# Patient Record
Sex: Female | Born: 1935
Health system: Southern US, Community
[De-identification: ages and names within clinical notes are randomized; demographics above are authoritative.]

## PROBLEM LIST (undated history)

## (undated) DIAGNOSIS — N816 Rectocele: Secondary | ICD-10-CM

## (undated) DIAGNOSIS — Z8249 Family history of ischemic heart disease and other diseases of the circulatory system: Secondary | ICD-10-CM

## (undated) DIAGNOSIS — B1910 Unspecified viral hepatitis B without hepatic coma: Secondary | ICD-10-CM

## (undated) DIAGNOSIS — R0789 Other chest pain: Secondary | ICD-10-CM

## (undated) DIAGNOSIS — R300 Dysuria: Secondary | ICD-10-CM

## (undated) DIAGNOSIS — K219 Gastro-esophageal reflux disease without esophagitis: Secondary | ICD-10-CM

## (undated) DIAGNOSIS — R310 Gross hematuria: Secondary | ICD-10-CM

## (undated) DIAGNOSIS — D649 Anemia, unspecified: Secondary | ICD-10-CM

## (undated) DIAGNOSIS — Z860100 Personal history of colon polyps, unspecified: Secondary | ICD-10-CM

## (undated) DIAGNOSIS — K5731 Diverticulosis of large intestine without perforation or abscess with bleeding: Secondary | ICD-10-CM

## (undated) DIAGNOSIS — I10 Essential (primary) hypertension: Secondary | ICD-10-CM

## (undated) DIAGNOSIS — M8580 Other specified disorders of bone density and structure, unspecified site: Secondary | ICD-10-CM

## (undated) DIAGNOSIS — R0989 Other specified symptoms and signs involving the circulatory and respiratory systems: Secondary | ICD-10-CM

## (undated) DIAGNOSIS — E785 Hyperlipidemia, unspecified: Secondary | ICD-10-CM

## (undated) DIAGNOSIS — H409 Unspecified glaucoma: Secondary | ICD-10-CM

## (undated) DIAGNOSIS — N8110 Cystocele, unspecified: Secondary | ICD-10-CM

## (undated) HISTORY — DX: Anemia, unspecified: D64.9

## (undated) HISTORY — DX: Other chest pain: R07.89

## (undated) HISTORY — DX: Cystocele, unspecified: N81.10

## (undated) HISTORY — DX: Unspecified viral hepatitis B without hepatic coma: B19.10

## (undated) HISTORY — DX: Gross hematuria: R31.0

## (undated) HISTORY — DX: Family history of ischemic heart disease and other diseases of the circulatory system: Z82.49

## (undated) HISTORY — DX: Gastro-esophageal reflux disease without esophagitis: K21.9

## (undated) HISTORY — DX: Other specified symptoms and signs involving the circulatory and respiratory systems: R09.89

## (undated) HISTORY — DX: Diverticulosis of large intestine without perforation or abscess with bleeding: K57.31

## (undated) HISTORY — DX: Other specified disorders of bone density and structure, unspecified site: M85.80

## (undated) HISTORY — DX: Hyperlipidemia, unspecified: E78.5

## (undated) HISTORY — DX: Personal history of colon polyps, unspecified: Z86.0100

## (undated) HISTORY — DX: Rectocele: N81.6

## (undated) HISTORY — DX: Essential (primary) hypertension: I10

## (undated) HISTORY — DX: Unspecified glaucoma: H40.9

## (undated) HISTORY — DX: Dysuria: R30.0

## (undated) HISTORY — PX: KNEE SURGERY: SHX244

## (undated) HISTORY — DX: Personal history of colonic polyps: Z86.010

---

## 1996-03-05 HISTORY — PX: BREAST REDUCTION SURGERY: SHX8

## 2002-03-05 HISTORY — PX: KNEE ARTHROSCOPY: SUR90

## 2002-07-26 ENCOUNTER — Encounter: Payer: Self-pay | Admitting: Unknown Physician Specialty

## 2002-07-26 ENCOUNTER — Encounter: Admission: RE | Admit: 2002-07-26 | Discharge: 2002-07-26 | Payer: Self-pay | Admitting: Unknown Physician Specialty

## 2003-03-06 HISTORY — PX: KNEE ARTHROSCOPY: SUR90

## 2010-11-08 ENCOUNTER — Other Ambulatory Visit: Payer: Self-pay | Admitting: Obstetrics and Gynecology

## 2010-11-08 DIAGNOSIS — N644 Mastodynia: Secondary | ICD-10-CM

## 2010-11-15 ENCOUNTER — Ambulatory Visit
Admission: RE | Admit: 2010-11-15 | Discharge: 2010-11-15 | Disposition: A | Discharge status: Home / Self Care | Payer: Medicare Other | Source: Ambulatory Visit | Attending: Obstetrics and Gynecology | Admitting: Obstetrics and Gynecology

## 2010-11-15 DIAGNOSIS — N644 Mastodynia: Secondary | ICD-10-CM

## 2012-02-11 HISTORY — PX: COLONOSCOPY: SHX174

## 2012-06-18 HISTORY — PX: UPPER GASTROINTESTINAL ENDOSCOPY: SHX188

## 2014-03-22 DIAGNOSIS — L659 Nonscarring hair loss, unspecified: Secondary | ICD-10-CM | POA: Diagnosis not present

## 2014-03-22 DIAGNOSIS — R002 Palpitations: Secondary | ICD-10-CM | POA: Diagnosis not present

## 2014-03-22 DIAGNOSIS — R0989 Other specified symptoms and signs involving the circulatory and respiratory systems: Secondary | ICD-10-CM | POA: Diagnosis not present

## 2014-03-22 DIAGNOSIS — Z23 Encounter for immunization: Secondary | ICD-10-CM | POA: Diagnosis not present

## 2014-03-22 DIAGNOSIS — Z0000 Encounter for general adult medical examination without abnormal findings: Secondary | ICD-10-CM | POA: Diagnosis not present

## 2014-03-22 DIAGNOSIS — I10 Essential (primary) hypertension: Secondary | ICD-10-CM | POA: Diagnosis not present

## 2014-03-22 DIAGNOSIS — R300 Dysuria: Secondary | ICD-10-CM | POA: Diagnosis not present

## 2014-03-22 DIAGNOSIS — Z87820 Personal history of traumatic brain injury: Secondary | ICD-10-CM | POA: Diagnosis not present

## 2014-03-30 DIAGNOSIS — R0989 Other specified symptoms and signs involving the circulatory and respiratory systems: Secondary | ICD-10-CM | POA: Diagnosis not present

## 2014-03-30 DIAGNOSIS — Z87820 Personal history of traumatic brain injury: Secondary | ICD-10-CM | POA: Diagnosis not present

## 2014-03-30 DIAGNOSIS — S0990XA Unspecified injury of head, initial encounter: Secondary | ICD-10-CM | POA: Diagnosis not present

## 2014-03-30 DIAGNOSIS — R42 Dizziness and giddiness: Secondary | ICD-10-CM | POA: Diagnosis not present

## 2014-03-30 DIAGNOSIS — S199XXA Unspecified injury of neck, initial encounter: Secondary | ICD-10-CM | POA: Diagnosis not present

## 2014-03-30 DIAGNOSIS — I6522 Occlusion and stenosis of left carotid artery: Secondary | ICD-10-CM | POA: Diagnosis not present

## 2014-03-31 DIAGNOSIS — Z01419 Encounter for gynecological examination (general) (routine) without abnormal findings: Secondary | ICD-10-CM | POA: Diagnosis not present

## 2014-03-31 DIAGNOSIS — R350 Frequency of micturition: Secondary | ICD-10-CM | POA: Diagnosis not present

## 2014-05-04 DIAGNOSIS — H4011X1 Primary open-angle glaucoma, mild stage: Secondary | ICD-10-CM | POA: Diagnosis not present

## 2014-05-18 DIAGNOSIS — N3000 Acute cystitis without hematuria: Secondary | ICD-10-CM | POA: Diagnosis not present

## 2014-05-18 DIAGNOSIS — N3091 Cystitis, unspecified with hematuria: Secondary | ICD-10-CM | POA: Diagnosis not present

## 2014-06-15 DIAGNOSIS — H4011X1 Primary open-angle glaucoma, mild stage: Secondary | ICD-10-CM | POA: Diagnosis not present

## 2014-06-23 DIAGNOSIS — N8182 Incompetence or weakening of pubocervical tissue: Secondary | ICD-10-CM | POA: Diagnosis not present

## 2014-07-09 DIAGNOSIS — R0602 Shortness of breath: Secondary | ICD-10-CM | POA: Diagnosis not present

## 2014-07-09 DIAGNOSIS — R300 Dysuria: Secondary | ICD-10-CM | POA: Diagnosis not present

## 2014-07-09 DIAGNOSIS — N3091 Cystitis, unspecified with hematuria: Secondary | ICD-10-CM | POA: Diagnosis not present

## 2014-09-03 DIAGNOSIS — L299 Pruritus, unspecified: Secondary | ICD-10-CM | POA: Diagnosis not present

## 2014-09-03 DIAGNOSIS — R1013 Epigastric pain: Secondary | ICD-10-CM | POA: Diagnosis not present

## 2014-10-14 DIAGNOSIS — L219 Seborrheic dermatitis, unspecified: Secondary | ICD-10-CM | POA: Diagnosis not present

## 2014-10-14 DIAGNOSIS — L300 Nummular dermatitis: Secondary | ICD-10-CM | POA: Diagnosis not present

## 2014-10-14 DIAGNOSIS — L820 Inflamed seborrheic keratosis: Secondary | ICD-10-CM | POA: Diagnosis not present

## 2014-11-09 DIAGNOSIS — S2232XA Fracture of one rib, left side, initial encounter for closed fracture: Secondary | ICD-10-CM | POA: Diagnosis not present

## 2014-11-12 DIAGNOSIS — I313 Pericardial effusion (noninflammatory): Secondary | ICD-10-CM | POA: Diagnosis not present

## 2014-11-12 DIAGNOSIS — S2242XA Multiple fractures of ribs, left side, initial encounter for closed fracture: Secondary | ICD-10-CM | POA: Diagnosis not present

## 2014-11-12 DIAGNOSIS — J90 Pleural effusion, not elsewhere classified: Secondary | ICD-10-CM | POA: Diagnosis not present

## 2014-12-07 DIAGNOSIS — N8182 Incompetence or weakening of pubocervical tissue: Secondary | ICD-10-CM | POA: Diagnosis not present

## 2015-01-12 DIAGNOSIS — R0789 Other chest pain: Secondary | ICD-10-CM | POA: Diagnosis not present

## 2015-01-12 DIAGNOSIS — R32 Unspecified urinary incontinence: Secondary | ICD-10-CM | POA: Diagnosis not present

## 2015-01-12 DIAGNOSIS — J918 Pleural effusion in other conditions classified elsewhere: Secondary | ICD-10-CM | POA: Diagnosis not present

## 2015-01-12 DIAGNOSIS — R079 Chest pain, unspecified: Secondary | ICD-10-CM | POA: Diagnosis not present

## 2015-01-12 DIAGNOSIS — R0602 Shortness of breath: Secondary | ICD-10-CM | POA: Diagnosis not present

## 2015-01-12 DIAGNOSIS — M549 Dorsalgia, unspecified: Secondary | ICD-10-CM | POA: Diagnosis not present

## 2015-02-03 DIAGNOSIS — N390 Urinary tract infection, site not specified: Secondary | ICD-10-CM | POA: Diagnosis not present

## 2015-02-03 DIAGNOSIS — N3941 Urge incontinence: Secondary | ICD-10-CM | POA: Diagnosis not present

## 2015-02-16 DIAGNOSIS — I491 Atrial premature depolarization: Secondary | ICD-10-CM | POA: Diagnosis not present

## 2015-02-16 DIAGNOSIS — I10 Essential (primary) hypertension: Secondary | ICD-10-CM | POA: Diagnosis not present

## 2015-02-16 DIAGNOSIS — R0609 Other forms of dyspnea: Secondary | ICD-10-CM | POA: Diagnosis not present

## 2015-04-17 DIAGNOSIS — H6123 Impacted cerumen, bilateral: Secondary | ICD-10-CM | POA: Diagnosis not present

## 2015-04-17 DIAGNOSIS — J069 Acute upper respiratory infection, unspecified: Secondary | ICD-10-CM | POA: Diagnosis not present

## 2015-05-09 DIAGNOSIS — H401132 Primary open-angle glaucoma, bilateral, moderate stage: Secondary | ICD-10-CM | POA: Diagnosis not present

## 2015-06-07 DIAGNOSIS — N8182 Incompetence or weakening of pubocervical tissue: Secondary | ICD-10-CM | POA: Diagnosis not present

## 2015-07-01 DIAGNOSIS — H25811 Combined forms of age-related cataract, right eye: Secondary | ICD-10-CM | POA: Diagnosis not present

## 2015-07-01 DIAGNOSIS — H04123 Dry eye syndrome of bilateral lacrimal glands: Secondary | ICD-10-CM | POA: Diagnosis not present

## 2015-07-01 DIAGNOSIS — H52221 Regular astigmatism, right eye: Secondary | ICD-10-CM | POA: Diagnosis not present

## 2015-07-01 DIAGNOSIS — Z961 Presence of intraocular lens: Secondary | ICD-10-CM | POA: Diagnosis not present

## 2015-07-08 DIAGNOSIS — M810 Age-related osteoporosis without current pathological fracture: Secondary | ICD-10-CM | POA: Diagnosis not present

## 2015-07-08 DIAGNOSIS — Z1239 Encounter for other screening for malignant neoplasm of breast: Secondary | ICD-10-CM | POA: Diagnosis not present

## 2015-07-08 DIAGNOSIS — Z1231 Encounter for screening mammogram for malignant neoplasm of breast: Secondary | ICD-10-CM | POA: Diagnosis not present

## 2015-08-16 DIAGNOSIS — Z8619 Personal history of other infectious and parasitic diseases: Secondary | ICD-10-CM | POA: Diagnosis not present

## 2015-08-16 DIAGNOSIS — H409 Unspecified glaucoma: Secondary | ICD-10-CM | POA: Diagnosis not present

## 2015-08-16 DIAGNOSIS — K219 Gastro-esophageal reflux disease without esophagitis: Secondary | ICD-10-CM | POA: Diagnosis not present

## 2015-08-16 DIAGNOSIS — I10 Essential (primary) hypertension: Secondary | ICD-10-CM | POA: Diagnosis not present

## 2015-08-16 DIAGNOSIS — Z79899 Other long term (current) drug therapy: Secondary | ICD-10-CM | POA: Diagnosis not present

## 2015-08-16 DIAGNOSIS — H25811 Combined forms of age-related cataract, right eye: Secondary | ICD-10-CM | POA: Diagnosis not present

## 2015-09-02 DIAGNOSIS — N3000 Acute cystitis without hematuria: Secondary | ICD-10-CM | POA: Diagnosis not present

## 2015-09-02 DIAGNOSIS — R300 Dysuria: Secondary | ICD-10-CM | POA: Diagnosis not present

## 2015-10-17 DIAGNOSIS — M810 Age-related osteoporosis without current pathological fracture: Secondary | ICD-10-CM | POA: Diagnosis not present

## 2015-10-17 DIAGNOSIS — N8182 Incompetence or weakening of pubocervical tissue: Secondary | ICD-10-CM | POA: Diagnosis not present

## 2016-01-17 DIAGNOSIS — R1011 Right upper quadrant pain: Secondary | ICD-10-CM | POA: Diagnosis not present

## 2016-01-17 DIAGNOSIS — D510 Vitamin B12 deficiency anemia due to intrinsic factor deficiency: Secondary | ICD-10-CM | POA: Diagnosis not present

## 2016-01-17 DIAGNOSIS — I10 Essential (primary) hypertension: Secondary | ICD-10-CM | POA: Diagnosis not present

## 2016-01-17 DIAGNOSIS — Z0000 Encounter for general adult medical examination without abnormal findings: Secondary | ICD-10-CM | POA: Diagnosis not present

## 2016-01-17 DIAGNOSIS — Z23 Encounter for immunization: Secondary | ICD-10-CM | POA: Diagnosis not present

## 2016-01-17 DIAGNOSIS — Z8619 Personal history of other infectious and parasitic diseases: Secondary | ICD-10-CM | POA: Diagnosis not present

## 2016-01-20 DIAGNOSIS — R1011 Right upper quadrant pain: Secondary | ICD-10-CM | POA: Diagnosis not present

## 2016-04-10 DIAGNOSIS — H401132 Primary open-angle glaucoma, bilateral, moderate stage: Secondary | ICD-10-CM | POA: Diagnosis not present

## 2016-05-03 DIAGNOSIS — I10 Essential (primary) hypertension: Secondary | ICD-10-CM | POA: Diagnosis not present

## 2016-05-03 DIAGNOSIS — R0609 Other forms of dyspnea: Secondary | ICD-10-CM | POA: Diagnosis not present

## 2016-05-03 DIAGNOSIS — I491 Atrial premature depolarization: Secondary | ICD-10-CM | POA: Diagnosis not present

## 2016-07-03 DIAGNOSIS — I10 Essential (primary) hypertension: Secondary | ICD-10-CM | POA: Diagnosis not present

## 2016-07-03 DIAGNOSIS — R079 Chest pain, unspecified: Secondary | ICD-10-CM | POA: Diagnosis not present

## 2016-07-03 DIAGNOSIS — R002 Palpitations: Secondary | ICD-10-CM | POA: Diagnosis not present

## 2016-07-04 DIAGNOSIS — I10 Essential (primary) hypertension: Secondary | ICD-10-CM | POA: Diagnosis not present

## 2016-07-04 DIAGNOSIS — R002 Palpitations: Secondary | ICD-10-CM | POA: Diagnosis not present

## 2016-07-04 DIAGNOSIS — R079 Chest pain, unspecified: Secondary | ICD-10-CM | POA: Diagnosis not present

## 2016-07-12 DIAGNOSIS — Z124 Encounter for screening for malignant neoplasm of cervix: Secondary | ICD-10-CM | POA: Diagnosis not present

## 2016-07-12 DIAGNOSIS — Z1231 Encounter for screening mammogram for malignant neoplasm of breast: Secondary | ICD-10-CM | POA: Diagnosis not present

## 2016-07-12 DIAGNOSIS — Z01419 Encounter for gynecological examination (general) (routine) without abnormal findings: Secondary | ICD-10-CM | POA: Diagnosis not present

## 2016-09-19 DIAGNOSIS — M25551 Pain in right hip: Secondary | ICD-10-CM | POA: Diagnosis not present

## 2016-09-26 DIAGNOSIS — N8110 Cystocele, unspecified: Secondary | ICD-10-CM | POA: Diagnosis not present

## 2016-10-15 DIAGNOSIS — S32501A Unspecified fracture of right pubis, initial encounter for closed fracture: Secondary | ICD-10-CM | POA: Diagnosis not present

## 2016-10-15 DIAGNOSIS — S32511A Fracture of superior rim of right pubis, initial encounter for closed fracture: Secondary | ICD-10-CM | POA: Diagnosis not present

## 2016-10-15 DIAGNOSIS — R079 Chest pain, unspecified: Secondary | ICD-10-CM | POA: Diagnosis not present

## 2016-10-15 DIAGNOSIS — M25551 Pain in right hip: Secondary | ICD-10-CM | POA: Diagnosis not present

## 2016-10-15 DIAGNOSIS — K5730 Diverticulosis of large intestine without perforation or abscess without bleeding: Secondary | ICD-10-CM | POA: Diagnosis not present

## 2016-11-01 DIAGNOSIS — M9903 Segmental and somatic dysfunction of lumbar region: Secondary | ICD-10-CM | POA: Diagnosis not present

## 2016-11-01 DIAGNOSIS — S39012A Strain of muscle, fascia and tendon of lower back, initial encounter: Secondary | ICD-10-CM | POA: Diagnosis not present

## 2016-11-01 DIAGNOSIS — M9904 Segmental and somatic dysfunction of sacral region: Secondary | ICD-10-CM | POA: Diagnosis not present

## 2016-11-01 DIAGNOSIS — S336XXA Sprain of sacroiliac joint, initial encounter: Secondary | ICD-10-CM | POA: Diagnosis not present

## 2016-11-01 DIAGNOSIS — M5136 Other intervertebral disc degeneration, lumbar region: Secondary | ICD-10-CM | POA: Diagnosis not present

## 2016-11-06 DIAGNOSIS — S32511A Fracture of superior rim of right pubis, initial encounter for closed fracture: Secondary | ICD-10-CM | POA: Diagnosis not present

## 2016-11-06 DIAGNOSIS — M810 Age-related osteoporosis without current pathological fracture: Secondary | ICD-10-CM | POA: Diagnosis not present

## 2016-11-20 DIAGNOSIS — R262 Difficulty in walking, not elsewhere classified: Secondary | ICD-10-CM | POA: Diagnosis not present

## 2016-11-20 DIAGNOSIS — M25551 Pain in right hip: Secondary | ICD-10-CM | POA: Diagnosis not present

## 2016-11-20 DIAGNOSIS — M25651 Stiffness of right hip, not elsewhere classified: Secondary | ICD-10-CM | POA: Diagnosis not present

## 2016-11-20 DIAGNOSIS — M6281 Muscle weakness (generalized): Secondary | ICD-10-CM | POA: Diagnosis not present

## 2016-11-22 DIAGNOSIS — R262 Difficulty in walking, not elsewhere classified: Secondary | ICD-10-CM | POA: Diagnosis not present

## 2016-11-22 DIAGNOSIS — M25651 Stiffness of right hip, not elsewhere classified: Secondary | ICD-10-CM | POA: Diagnosis not present

## 2016-11-22 DIAGNOSIS — M25551 Pain in right hip: Secondary | ICD-10-CM | POA: Diagnosis not present

## 2016-11-22 DIAGNOSIS — M6281 Muscle weakness (generalized): Secondary | ICD-10-CM | POA: Diagnosis not present

## 2016-11-26 DIAGNOSIS — R262 Difficulty in walking, not elsewhere classified: Secondary | ICD-10-CM | POA: Diagnosis not present

## 2016-11-26 DIAGNOSIS — M6281 Muscle weakness (generalized): Secondary | ICD-10-CM | POA: Diagnosis not present

## 2016-11-26 DIAGNOSIS — M25551 Pain in right hip: Secondary | ICD-10-CM | POA: Diagnosis not present

## 2016-11-26 DIAGNOSIS — M25651 Stiffness of right hip, not elsewhere classified: Secondary | ICD-10-CM | POA: Diagnosis not present

## 2016-11-26 DIAGNOSIS — M810 Age-related osteoporosis without current pathological fracture: Secondary | ICD-10-CM | POA: Diagnosis not present

## 2016-11-27 DIAGNOSIS — H401132 Primary open-angle glaucoma, bilateral, moderate stage: Secondary | ICD-10-CM | POA: Diagnosis not present

## 2016-11-28 DIAGNOSIS — M25651 Stiffness of right hip, not elsewhere classified: Secondary | ICD-10-CM | POA: Diagnosis not present

## 2016-11-28 DIAGNOSIS — R262 Difficulty in walking, not elsewhere classified: Secondary | ICD-10-CM | POA: Diagnosis not present

## 2016-11-28 DIAGNOSIS — M25551 Pain in right hip: Secondary | ICD-10-CM | POA: Diagnosis not present

## 2016-11-28 DIAGNOSIS — M6281 Muscle weakness (generalized): Secondary | ICD-10-CM | POA: Diagnosis not present

## 2016-12-04 DIAGNOSIS — R262 Difficulty in walking, not elsewhere classified: Secondary | ICD-10-CM | POA: Diagnosis not present

## 2016-12-04 DIAGNOSIS — M25551 Pain in right hip: Secondary | ICD-10-CM | POA: Diagnosis not present

## 2016-12-04 DIAGNOSIS — M6281 Muscle weakness (generalized): Secondary | ICD-10-CM | POA: Diagnosis not present

## 2016-12-04 DIAGNOSIS — S32511A Fracture of superior rim of right pubis, initial encounter for closed fracture: Secondary | ICD-10-CM | POA: Diagnosis not present

## 2016-12-04 DIAGNOSIS — M25651 Stiffness of right hip, not elsewhere classified: Secondary | ICD-10-CM | POA: Diagnosis not present

## 2016-12-06 DIAGNOSIS — M6281 Muscle weakness (generalized): Secondary | ICD-10-CM | POA: Diagnosis not present

## 2016-12-06 DIAGNOSIS — M25651 Stiffness of right hip, not elsewhere classified: Secondary | ICD-10-CM | POA: Diagnosis not present

## 2016-12-06 DIAGNOSIS — M25551 Pain in right hip: Secondary | ICD-10-CM | POA: Diagnosis not present

## 2016-12-06 DIAGNOSIS — R262 Difficulty in walking, not elsewhere classified: Secondary | ICD-10-CM | POA: Diagnosis not present

## 2016-12-28 DIAGNOSIS — Z23 Encounter for immunization: Secondary | ICD-10-CM | POA: Diagnosis not present

## 2017-01-04 DIAGNOSIS — M1711 Unilateral primary osteoarthritis, right knee: Secondary | ICD-10-CM | POA: Diagnosis not present

## 2017-01-04 DIAGNOSIS — S32511A Fracture of superior rim of right pubis, initial encounter for closed fracture: Secondary | ICD-10-CM | POA: Diagnosis not present

## 2017-01-04 DIAGNOSIS — M25561 Pain in right knee: Secondary | ICD-10-CM | POA: Diagnosis not present

## 2017-01-07 DIAGNOSIS — M1711 Unilateral primary osteoarthritis, right knee: Secondary | ICD-10-CM | POA: Diagnosis not present

## 2017-01-28 DIAGNOSIS — N8182 Incompetence or weakening of pubocervical tissue: Secondary | ICD-10-CM | POA: Diagnosis not present

## 2017-02-18 DIAGNOSIS — J029 Acute pharyngitis, unspecified: Secondary | ICD-10-CM | POA: Diagnosis not present

## 2017-02-21 DIAGNOSIS — M545 Low back pain: Secondary | ICD-10-CM | POA: Diagnosis not present

## 2017-03-04 DIAGNOSIS — M545 Low back pain: Secondary | ICD-10-CM | POA: Diagnosis not present

## 2017-03-04 DIAGNOSIS — M25551 Pain in right hip: Secondary | ICD-10-CM | POA: Diagnosis not present

## 2017-03-06 DIAGNOSIS — M5416 Radiculopathy, lumbar region: Secondary | ICD-10-CM | POA: Diagnosis not present

## 2017-03-06 DIAGNOSIS — M545 Low back pain: Secondary | ICD-10-CM | POA: Diagnosis not present

## 2017-03-06 DIAGNOSIS — S3992XA Unspecified injury of lower back, initial encounter: Secondary | ICD-10-CM | POA: Diagnosis not present

## 2017-03-07 DIAGNOSIS — M545 Low back pain: Secondary | ICD-10-CM | POA: Diagnosis not present

## 2017-05-09 DIAGNOSIS — R300 Dysuria: Secondary | ICD-10-CM | POA: Diagnosis not present

## 2017-05-30 DIAGNOSIS — I10 Essential (primary) hypertension: Secondary | ICD-10-CM | POA: Diagnosis not present

## 2017-05-30 DIAGNOSIS — M7989 Other specified soft tissue disorders: Secondary | ICD-10-CM | POA: Diagnosis not present

## 2017-05-30 DIAGNOSIS — R6889 Other general symptoms and signs: Secondary | ICD-10-CM | POA: Diagnosis not present

## 2017-06-13 DIAGNOSIS — D510 Vitamin B12 deficiency anemia due to intrinsic factor deficiency: Secondary | ICD-10-CM | POA: Diagnosis not present

## 2017-06-13 DIAGNOSIS — I10 Essential (primary) hypertension: Secondary | ICD-10-CM | POA: Diagnosis not present

## 2017-06-13 DIAGNOSIS — E559 Vitamin D deficiency, unspecified: Secondary | ICD-10-CM | POA: Diagnosis not present

## 2017-06-13 DIAGNOSIS — M7989 Other specified soft tissue disorders: Secondary | ICD-10-CM | POA: Diagnosis not present

## 2017-06-13 DIAGNOSIS — R109 Unspecified abdominal pain: Secondary | ICD-10-CM | POA: Diagnosis not present

## 2017-06-13 DIAGNOSIS — M130 Polyarthritis, unspecified: Secondary | ICD-10-CM | POA: Diagnosis not present

## 2017-06-13 DIAGNOSIS — Z7689 Persons encountering health services in other specified circumstances: Secondary | ICD-10-CM | POA: Diagnosis not present

## 2017-06-17 ENCOUNTER — Encounter: Payer: Self-pay | Admitting: Gastroenterology

## 2017-06-18 DIAGNOSIS — R1011 Right upper quadrant pain: Secondary | ICD-10-CM | POA: Diagnosis not present

## 2017-06-27 DIAGNOSIS — R932 Abnormal findings on diagnostic imaging of liver and biliary tract: Secondary | ICD-10-CM | POA: Diagnosis not present

## 2017-06-27 DIAGNOSIS — I10 Essential (primary) hypertension: Secondary | ICD-10-CM | POA: Diagnosis not present

## 2017-06-27 DIAGNOSIS — E538 Deficiency of other specified B group vitamins: Secondary | ICD-10-CM | POA: Diagnosis not present

## 2017-07-12 ENCOUNTER — Encounter: Payer: Self-pay | Admitting: Gastroenterology

## 2017-07-16 DIAGNOSIS — Y92009 Unspecified place in unspecified non-institutional (private) residence as the place of occurrence of the external cause: Secondary | ICD-10-CM | POA: Diagnosis not present

## 2017-07-16 DIAGNOSIS — W19XXXA Unspecified fall, initial encounter: Secondary | ICD-10-CM | POA: Diagnosis not present

## 2017-07-16 DIAGNOSIS — R0781 Pleurodynia: Secondary | ICD-10-CM | POA: Diagnosis not present

## 2017-08-07 ENCOUNTER — Encounter: Payer: Self-pay | Admitting: Gastroenterology

## 2017-08-08 ENCOUNTER — Other Ambulatory Visit (INDEPENDENT_AMBULATORY_CARE_PROVIDER_SITE_OTHER): Payer: Medicare Other

## 2017-08-08 ENCOUNTER — Encounter: Payer: Self-pay | Admitting: Gastroenterology

## 2017-08-08 ENCOUNTER — Telehealth: Payer: Self-pay

## 2017-08-08 ENCOUNTER — Ambulatory Visit (INDEPENDENT_AMBULATORY_CARE_PROVIDER_SITE_OTHER): Payer: Medicare Other | Admitting: Gastroenterology

## 2017-08-08 VITALS — BP 140/72 | HR 58 | Ht 62.0 in | Wt 172.5 lb

## 2017-08-08 DIAGNOSIS — R109 Unspecified abdominal pain: Secondary | ICD-10-CM | POA: Diagnosis not present

## 2017-08-08 DIAGNOSIS — R935 Abnormal findings on diagnostic imaging of other abdominal regions, including retroperitoneum: Secondary | ICD-10-CM | POA: Diagnosis not present

## 2017-08-08 LAB — CBC WITH DIFFERENTIAL/PLATELET
BASOS PCT: 0.4 % (ref 0.0–3.0)
Basophils Absolute: 0 10*3/uL (ref 0.0–0.1)
EOS PCT: 1.6 % (ref 0.0–5.0)
Eosinophils Absolute: 0.1 10*3/uL (ref 0.0–0.7)
HEMATOCRIT: 36.6 % (ref 36.0–46.0)
Hemoglobin: 11.9 g/dL — ABNORMAL LOW (ref 12.0–15.0)
LYMPHS PCT: 31.2 % (ref 12.0–46.0)
Lymphs Abs: 2 10*3/uL (ref 0.7–4.0)
MCHC: 32.5 g/dL (ref 30.0–36.0)
MCV: 89.5 fl (ref 78.0–100.0)
MONOS PCT: 8 % (ref 3.0–12.0)
Monocytes Absolute: 0.5 10*3/uL (ref 0.1–1.0)
NEUTROS ABS: 3.7 10*3/uL (ref 1.4–7.7)
Neutrophils Relative %: 58.8 % (ref 43.0–77.0)
PLATELETS: 274 10*3/uL (ref 150.0–400.0)
RBC: 4.08 Mil/uL (ref 3.87–5.11)
RDW: 15.1 % (ref 11.5–15.5)
WBC: 6.3 10*3/uL (ref 4.0–10.5)

## 2017-08-08 LAB — COMPREHENSIVE METABOLIC PANEL
ALBUMIN: 4.1 g/dL (ref 3.5–5.2)
ALT: 10 U/L (ref 0–35)
AST: 13 U/L (ref 0–37)
Alkaline Phosphatase: 115 U/L (ref 39–117)
BUN: 26 mg/dL — ABNORMAL HIGH (ref 6–23)
CALCIUM: 9.6 mg/dL (ref 8.4–10.5)
CHLORIDE: 108 meq/L (ref 96–112)
CO2: 26 mEq/L (ref 19–32)
CREATININE: 0.97 mg/dL (ref 0.40–1.20)
GFR: 58.69 mL/min — ABNORMAL LOW (ref >60.00)
Glucose, Bld: 93 mg/dL (ref 70–99)
POTASSIUM: 4.1 meq/L (ref 3.5–5.1)
Sodium: 141 mEq/L (ref 135–145)
Total Bilirubin: 0.5 mg/dL (ref 0.2–1.2)
Total Protein: 6.3 g/dL (ref 6.0–8.3)

## 2017-08-08 LAB — SEDIMENTATION RATE: Sed Rate: 4 mm/hr (ref 0–30)

## 2017-08-08 LAB — C-REACTIVE PROTEIN: CRP: 0.1 mg/dL — AB (ref 0.5–20.0)

## 2017-08-08 MED ORDER — PREDNISONE 10 MG PO TABS: ORAL_TABLET | ORAL | 0 refills | Status: DC

## 2017-08-08 MED ORDER — DIPHENHYDRAMINE HCL 25 MG PO CAPS: ORAL_CAPSULE | ORAL | 0 refills | Status: DC

## 2017-08-08 NOTE — Telephone Encounter (Signed)
Called and let patient know that we sent medication in to pharmacy.

## 2017-08-08 NOTE — Progress Notes (Signed)
Chief Complaint: Right-sided abdominal pain/flank pain  Referring Provider:  Dr Cathi Roan      ASSESSMENT AND PLAN;   #1. Abnormal US showing fatty liver/hepatocellular disease 06/2017-patient with history of ? Hep B (diagnosed over 15 years ago when she went to donate blood)  #2. R Flank/RMQ pain - neg US kidneys 06/2017.  Last colonoscopy 02/2012 showing moderate to severe sigmoid diverticulosis, small internal hemorrhoids. I do believe that there is significant musculoskeletal component with associated kyphoscoliosis.  #3. GERD with HH on nexium 20mg  po qd. last EGD 06/2012 showing large hiatal hernia.  #4.  History of hepatitis B in the past. -Positive hepatitis B core antibody and positive hepatitis B surface antibody.  Appears to have cleared.  I could not find hepatitis B surface antigen in the previous labs.  Plan: -Check CBC, CMP, CRP,sed rate, HBsAg, HBV viral load. -Proceed with CT scan of the abdomen and pelvis with p.o. and IV contrast to further evaluate abdominal pain/abnormal ultrasound. -If she continues to have problems, would consider colonoscopy for further evaluation. -She can try using icy hot patch in the morning and heating pads at night to take care of musculoskeletal component. -I discussed above in detail with the patient and patient's family.   HPI:    Melinda Briggs is a 82 y.o. female   With history of chronic right flank pain lasting over last several years but getting worse lately. She had an ultrasound abdomen and renal ultrasound performed which shows fatty liver/chronic hepatocellular disease.  Patient was quite concerned about the same.  She describes this pain as more or less constant, nonradiating, exacerbated occasionally after eating, denies having any significant urinary problems.  No nausea, vomiting, heartburn, regurgitation, odynophagia or dysphagia.  No significant diarrhea or constipation.  There is no melena or hematochezia. No  unintentional weight loss.  No history of itching, skin lesions, easy bruisability, intake of over-the-counter medications including diet pills, herbal medications, anabolic steroids or Tylenol.  There is no history of blood transfusions, IV drug use or family history of liver disease.  No jaundice dark urine or pale stools.  No history of alcohol abuse.    Past Medical History:  Diagnosis Date  . Anemia   . Carotid bruit   . Diverticulosis of colon with hemorrhage   . Dysuria   . Family history of ischemic heart disease (IHD)   . GERD (gastroesophageal reflux disease)   . Glaucoma   . Hematuria, gross   . Hepatitis B   . History of colon polyps   . Hyperlipidemia   . Hypertension   . Non-cardiac chest pain     Past Surgical History:  Procedure Laterality Date  . BREAST REDUCTION SURGERY  1998  . COLONOSCOPY  02/11/2012   Moderate to severe sigmoid diverticulosis, small internal hemorrhoids.  Marland Kitchen KNEE ARTHROSCOPY  2004  . KNEE ARTHROSCOPY  2005  . KNEE SURGERY     x3  . UPPER GASTROINTESTINAL ENDOSCOPY  06/18/2012   Presbyesophagus, large hiatal hernia, healed cameron erosion, mild gastritis.    Family History  Problem Relation Age of Onset  . Breast cancer Maternal Grandmother   . Colon cancer Paternal Grandmother     Social History   Tobacco Use  . Smoking status: Never Smoker  . Smokeless tobacco: Never Used  Substance Use Topics  . Alcohol use: Not Currently  . Drug use: Never    Current Outpatient Medications  Medication Sig Dispense Refill  . bimatoprost (LUMIGAN)  0.03 % ophthalmic solution Place 1 drop into both eyes at bedtime.    . celecoxib (CELEBREX) 200 MG capsule Take 200 mg by mouth daily.    Marland Kitchen esomeprazole (NEXIUM) 20 MG capsule Take 20 mg by mouth daily before breakfast.    . lisinopril-hydrochlorothiazide (PRINZIDE,ZESTORETIC) 20-25 MG tablet Take 1 tablet by mouth daily.    . Metoprolol Succinate 25 MG CS24 Take 25 mg by mouth daily.     No  current facility-administered medications for this visit.     Not on File  Review of Systems:  Constitutional: Denies fever, chills, diaphoresis, appetite change and fatigue.  HEENT: Denies photophobia, eye pain, redness, hearing loss, ear pain, congestion, sore throat, rhinorrhea, sneezing, mouth sores, neck pain, neck stiffness and tinnitus.   Respiratory: Denies SOB, DOE, cough, chest tightness,  and wheezing.   Cardiovascular: Denies chest pain, palpitations and leg swelling.  Genitourinary: Denies dysuria, urgency, frequency, hematuria, flank pain and difficulty urinating.  Musculoskeletal: Has myalgias, back pain, joint swelling, arthralgias and gait problem.  Skin: No rash.  Neurological: Denies dizziness, seizures, syncope, weakness, light-headedness, numbness and headaches. Has back pain Hematological: Denies adenopathy. Easy bruising, personal or family bleeding history  Psychiatric/Behavioral: Has anxiety or depression     Physical Exam:    BP 140/72   Pulse (!) 58   Ht 5\' 2"  (1.575 m)   Wt 172 lb 8 oz (78.2 kg)   BMI 31.55 kg/m  Filed Weights   08/08/17 0840  Weight: 172 lb 8 oz (78.2 kg)   Constitutional:  Well-developed, in no acute distress. Psychiatric: Normal mood and affect. Behavior is normal. HEENT: Pupils normal.  Conjunctivae are normal. No scleral icterus. Neck supple.  Cardiovascular: Normal rate, regular rhythm. No edema Pulmonary/chest: Effort normal and breath sounds normal. No wheezing, rales or rhonchi. Abdominal: Soft, nondistended. Nontender. Bowel sounds active throughout. There are no masses palpable. No hepatomegaly. Rectal:  defered Neurological: Alert and oriented to person place and time. Skin: Skin is warm and dry. No rashes noted.  Extensive notes were reviewed.  I also reviewed the previous EGD and colonoscopy report    Carmell Austria, MD 08/08/2017, 8:53 AM  Cc: Dr. Cathi Roan

## 2017-08-08 NOTE — Patient Instructions (Signed)
If you are age 80 or older, your body mass index should be between 23-30. Your Body mass index is 31.55 kg/m. If this is out of the aforementioned range listed, please consider follow up with your Primary Care Provider.  If you are age 41 or younger, your body mass index should be between 19-25. Your Body mass index is 31.55 kg/m. If this is out of the aformentioned range listed, please consider follow up with your Primary Care Provider.    Please stop at the lab before you leave the office today.    You have been scheduled for a CT scan of the abdomen and pelvis at [place] are scheduled on  at . You should arrive 15 minutes prior to your appointment time for registration. Please follow the written instructions below on the day of your exam:  WARNING: IF YOU ARE ALLERGIC TO IODINE/X-RAY DYE, PLEASE NOTIFY RADIOLOGY IMMEDIATELY AT [phone]! YOU WILL BE GIVEN A 13 HOUR PREMEDICATION PREP.  1) Do not eat or drink anything after  (4 hours prior to your test) 2) You have been given 2 bottles of oral contrast to drink. The solution may taste better if refrigerated, but do NOT add ice or any other liquid to this solution. Shake well before drinking.    Drink 1 bottle of contrast @  (2 hours prior to your exam)  Drink 1 bottle of contrast @  (1 hour prior to your exam)  You may take any medications as prescribed with a small amount of water except for the following: Metformin, Glucophage, Glucovance, Avandamet, Riomet, Fortamet, Actoplus Met, Janumet, Glumetza or Metaglip. The above medications must be held the day of the exam AND 48 hours after the exam.  The purpose of you drinking the oral contrast is to aid in the visualization of your intestinal tract. The contrast solution may cause some diarrhea. Before your exam is started, you will be given a small amount of fluid to drink. Depending on your individual set of symptoms, you may also receive an intravenous injection  of x-ray contrast/dye. Plan on being at [facility] for 30 minutes or longer, depending on the type of exam you are having performed.  This test typically takes 30-45 minutes to complete.  If you have any questions regarding your exam or if you need to reschedule, you may call the CT department at [phone] between the hours of 8:00 am and 5:00 pm, Monday-Friday.  ________________________________________________________________________  Thank you,  Dr. Jackquline Denmark

## 2017-08-09 DIAGNOSIS — H40013 Open angle with borderline findings, low risk, bilateral: Secondary | ICD-10-CM | POA: Diagnosis not present

## 2017-08-09 LAB — HBV QUANT PCR RFX TO GENOTYPE: HBV IU/ML: NOT DETECTED [IU]/mL

## 2017-08-09 LAB — HEPATITIS B SURFACE ANTIGEN: Hepatitis B Surface Ag: NONREACTIVE

## 2017-08-12 ENCOUNTER — Telehealth: Payer: Self-pay | Admitting: Gastroenterology

## 2017-08-12 ENCOUNTER — Other Ambulatory Visit (HOSPITAL_BASED_OUTPATIENT_CLINIC_OR_DEPARTMENT_OTHER): Payer: Self-pay

## 2017-08-12 NOTE — Telephone Encounter (Signed)
Pt is scheduled for a CT scan abd  Tomorrow. Pt is requesting if imaging can include her left shoulder because it has been bothering her lately. Pls call her.

## 2017-08-12 NOTE — Telephone Encounter (Signed)
I spoke with the patient and let her know that we are unable to order a CT of her shoulder.  Suggested she contact PCP.

## 2017-08-13 ENCOUNTER — Ambulatory Visit (HOSPITAL_BASED_OUTPATIENT_CLINIC_OR_DEPARTMENT_OTHER)
Admission: RE | Admit: 2017-08-13 | Discharge: 2017-08-13 | Disposition: A | Discharge status: Home / Self Care | Payer: Medicare Other | Source: Ambulatory Visit | Attending: Gastroenterology | Admitting: Gastroenterology

## 2017-08-13 ENCOUNTER — Encounter (HOSPITAL_BASED_OUTPATIENT_CLINIC_OR_DEPARTMENT_OTHER): Payer: Self-pay

## 2017-08-13 DIAGNOSIS — R935 Abnormal findings on diagnostic imaging of other abdominal regions, including retroperitoneum: Secondary | ICD-10-CM

## 2017-08-13 DIAGNOSIS — I700 Atherosclerosis of aorta: Secondary | ICD-10-CM | POA: Diagnosis not present

## 2017-08-13 DIAGNOSIS — R109 Unspecified abdominal pain: Secondary | ICD-10-CM | POA: Diagnosis present

## 2017-08-13 DIAGNOSIS — M438X6 Other specified deforming dorsopathies, lumbar region: Secondary | ICD-10-CM | POA: Diagnosis not present

## 2017-08-13 DIAGNOSIS — K449 Diaphragmatic hernia without obstruction or gangrene: Secondary | ICD-10-CM | POA: Diagnosis not present

## 2017-08-13 DIAGNOSIS — S3210XA Unspecified fracture of sacrum, initial encounter for closed fracture: Secondary | ICD-10-CM | POA: Diagnosis not present

## 2017-08-13 DIAGNOSIS — X58XXXA Exposure to other specified factors, initial encounter: Secondary | ICD-10-CM | POA: Diagnosis not present

## 2017-08-13 MED ORDER — IOPAMIDOL (ISOVUE-300) INJECTION 61%
100.0000 mL | Freq: Once | INTRAVENOUS | Status: AC | PRN
  Administered 2017-08-13: 100 mL via INTRAVENOUS

## 2017-08-13 NOTE — Telephone Encounter (Signed)
I went over her lab results with her, again.

## 2017-08-16 ENCOUNTER — Telehealth: Payer: Self-pay | Admitting: Gastroenterology

## 2017-08-16 NOTE — Telephone Encounter (Signed)
Patient calling for CT results from 6.11.19.

## 2017-08-16 NOTE — Telephone Encounter (Signed)
See results notes for additional details. 

## 2017-08-19 ENCOUNTER — Telehealth: Payer: Self-pay | Admitting: Gastroenterology

## 2017-08-19 NOTE — Telephone Encounter (Signed)
See result note.  

## 2017-08-19 NOTE — Telephone Encounter (Signed)
Pt returned your call regarding CT results.

## 2017-10-07 DIAGNOSIS — R109 Unspecified abdominal pain: Secondary | ICD-10-CM | POA: Diagnosis not present

## 2017-10-07 DIAGNOSIS — I10 Essential (primary) hypertension: Secondary | ICD-10-CM | POA: Diagnosis not present

## 2017-10-07 DIAGNOSIS — G8929 Other chronic pain: Secondary | ICD-10-CM | POA: Diagnosis not present

## 2017-10-07 DIAGNOSIS — R0789 Other chest pain: Secondary | ICD-10-CM | POA: Diagnosis not present

## 2018-01-23 DIAGNOSIS — I10 Essential (primary) hypertension: Secondary | ICD-10-CM | POA: Diagnosis not present

## 2018-01-23 DIAGNOSIS — Z4689 Encounter for fitting and adjustment of other specified devices: Secondary | ICD-10-CM | POA: Diagnosis not present

## 2018-02-12 DIAGNOSIS — I10 Essential (primary) hypertension: Secondary | ICD-10-CM | POA: Diagnosis not present

## 2018-02-12 DIAGNOSIS — N3281 Overactive bladder: Secondary | ICD-10-CM | POA: Diagnosis not present

## 2018-02-12 DIAGNOSIS — Z0000 Encounter for general adult medical examination without abnormal findings: Secondary | ICD-10-CM | POA: Diagnosis not present

## 2018-02-12 DIAGNOSIS — Z13820 Encounter for screening for osteoporosis: Secondary | ICD-10-CM | POA: Diagnosis not present

## 2018-02-12 DIAGNOSIS — Z23 Encounter for immunization: Secondary | ICD-10-CM | POA: Diagnosis not present

## 2018-03-24 DIAGNOSIS — H40013 Open angle with borderline findings, low risk, bilateral: Secondary | ICD-10-CM | POA: Diagnosis not present

## 2018-07-05 IMAGING — CT CT ABD-PELV W/ CM
2 of 5 series · 16 of 46 positions shown, 18 images · IV contrast (APPLIED)
Comparison: 06/18/2017 renal ultrasound.

CLINICAL DATA: Right-sided flank pain for years.

EXAM:
CT ABDOMEN AND PELVIS WITH CONTRAST
TECHNIQUE: Multidetector CT imaging of the abdomen and pelvis was performed
using the standard protocol following bolus administration of
intravenous contrast.
CONTRAST:  100mL VVPGN6-SCC IOPAMIDOL (VVPGN6-SCC) INJECTION 61%

[Series 2: axial st · axial · 0.95mm/px · z∈[-524,-49]mm · 13 of 107 slices shown, 15 images]
[im 6/107  soft-tissue]
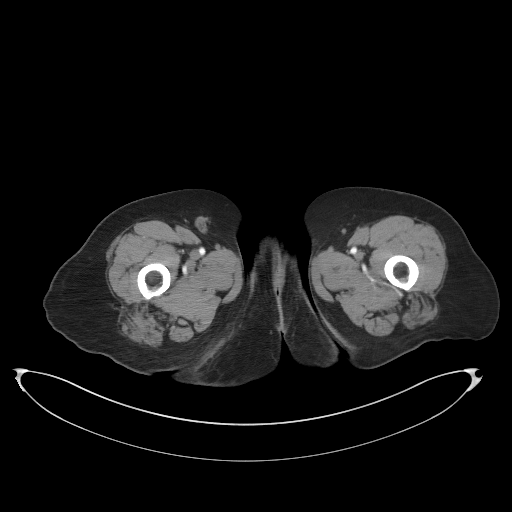
[im 6/107  bone]
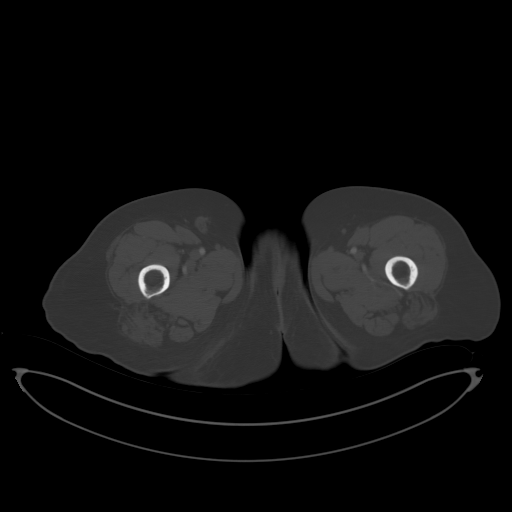
[im 16/107  soft-tissue]
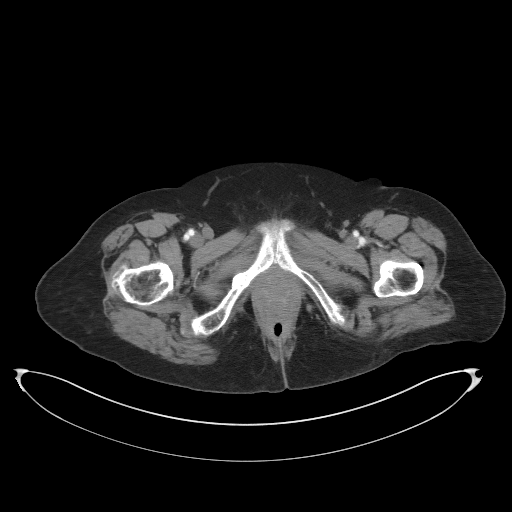
[im 21/107  soft-tissue]
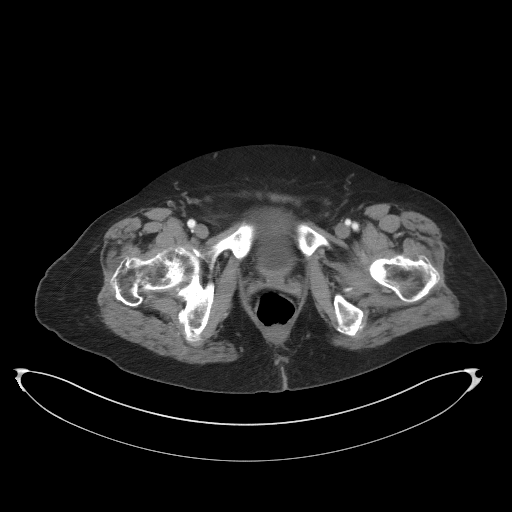
[im 31/107  soft-tissue]
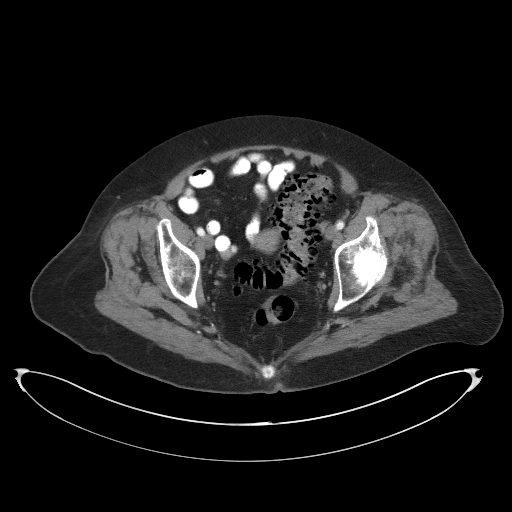
[im 36/107  soft-tissue]
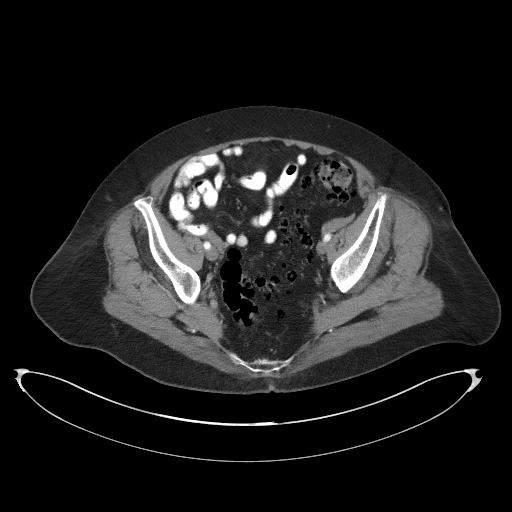
[im 46/107  soft-tissue]
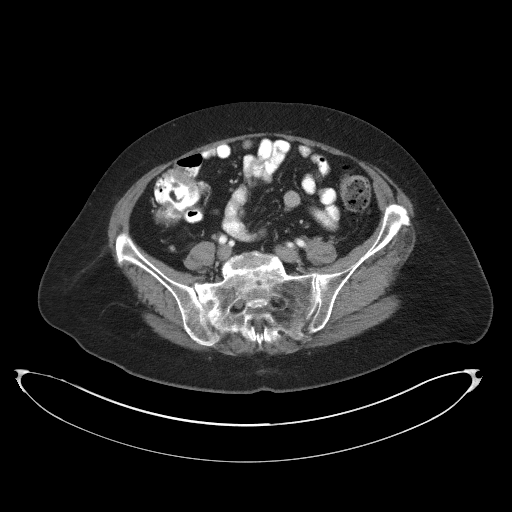
[im 56/107  soft-tissue]
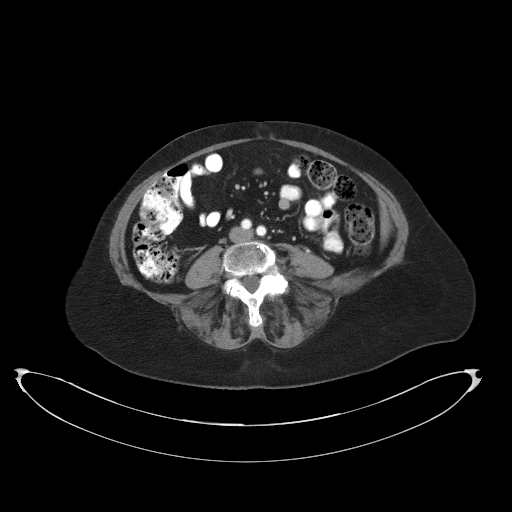
[im 61/107  soft-tissue]
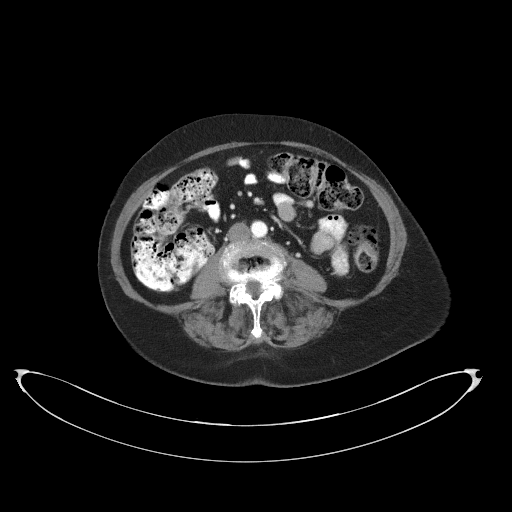
[im 71/107  soft-tissue]
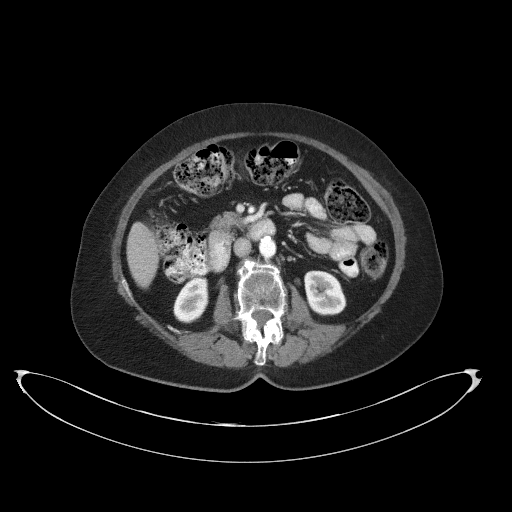
[im 71/107  bone]
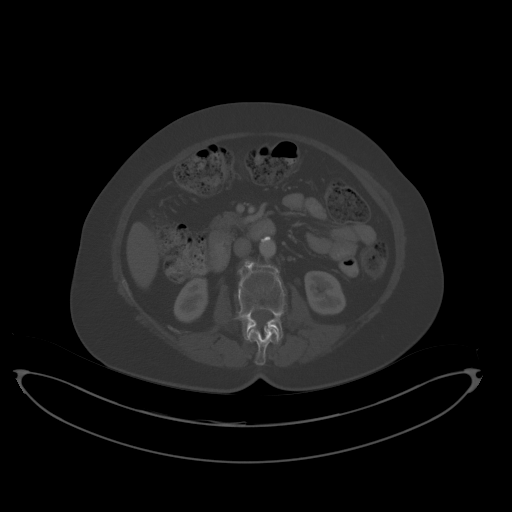
[im 76/107  soft-tissue]
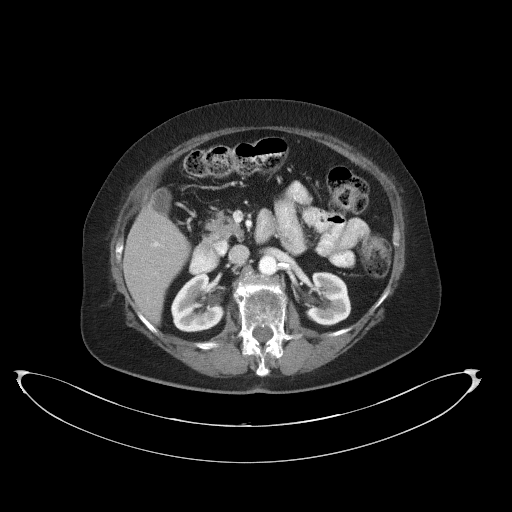
[im 86/107  soft-tissue]
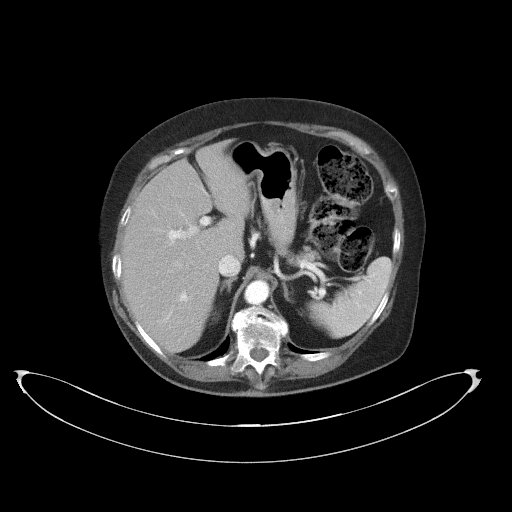
[im 91/107  soft-tissue]
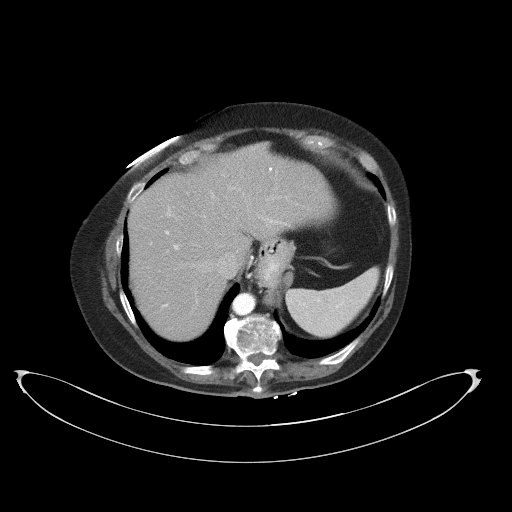
[im 101/107  soft-tissue]
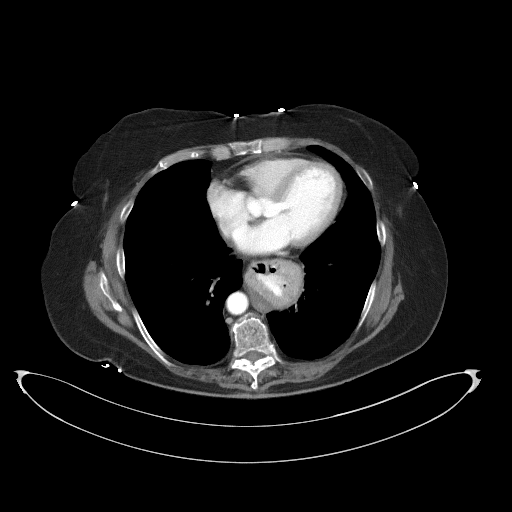

[Series 5: coronal st · coronal · 0.86mm/px · 3 of 97 slices shown]
[im 33/97  soft-tissue]
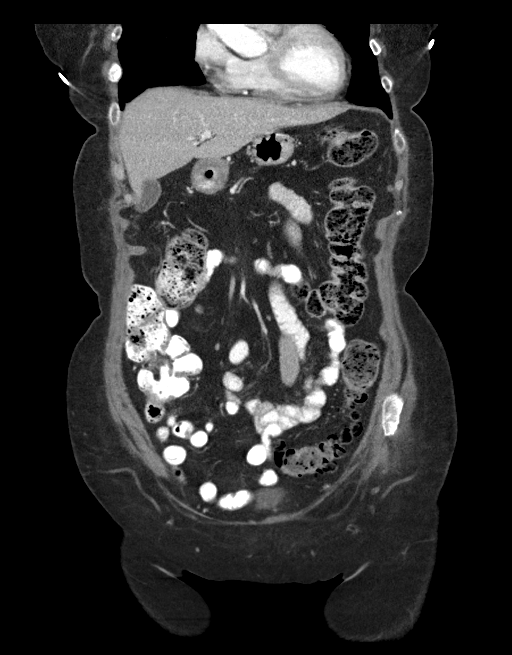
[im 43/97  soft-tissue]
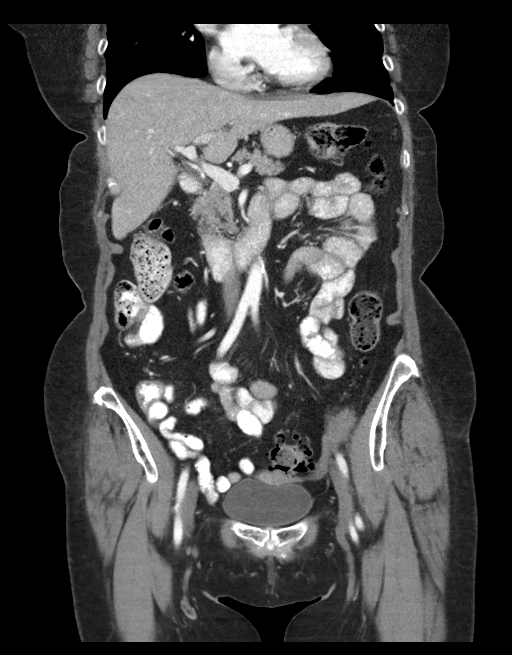
[im 54/97  soft-tissue]
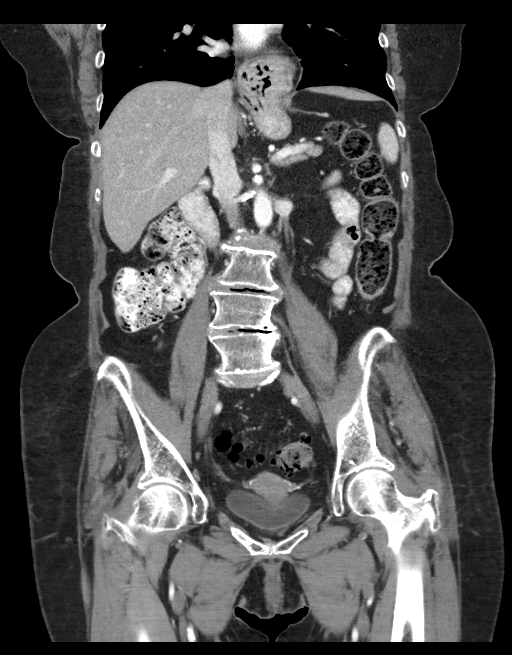

[16 of 46 positions shown; findings below may reference images not displayed]

Pelvic ultrasound
scattered pelvic CT 10/15/2016. Abdomen pelvic CT 11/01/2010 (report
not available).
FINDINGS: Lower chest: Clear lung bases. Normal heart size without pericardial
or pleural effusion. Moderate hiatal hernia.

Hepatobiliary: Right hepatic lobe too small to characterize lesions.
Normal gallbladder, without biliary ductal dilatation.

Pancreas: Normal, without mass or ductal dilatation.

Spleen: Normal in size, without focal abnormality.

Adrenals/Urinary Tract: Normal adrenal glands. Normal kidneys,
without hydronephrosis. Normal urinary bladder.

Stomach/Bowel: Normal remainder of the stomach. Colonic stool burden
suggests constipation. Muscular hypertrophy involving the sigmoid.
Normal terminal ileum and appendix. Normal small bowel.

Vascular/Lymphatic: Aortic and branch vessel atherosclerosis. No
abdominopelvic adenopathy.

Reproductive: Normal uterus and adnexa.

Other: No significant free fluid.

Musculoskeletal: Osteopenia. Remote right inferior pubic ramus
fracture. Right lateral sacral sclerosis, suspicious for interval
insufficiency fracture, as on 03/06/2017 lumbar spine MRI. Mild
superior endplate compression deformity at L1, felt to be new since
03/06/2017. Moderate lumbosacral spondylosis. S-shaped lumbar spine
curvature.
IMPRESSION: 1.  No acute process or explanation for right-sided pain.
2. Moderate hiatal hernia.
3.  Aortic Atherosclerosis ([id number]-[id number].H).
4. Right sacral fracture, as described on 03/06/2017. A mild
superior endplate compression deformity at L1 is favored to be new
since that MRI.

## 2018-10-22 DIAGNOSIS — H40013 Open angle with borderline findings, low risk, bilateral: Secondary | ICD-10-CM | POA: Diagnosis not present

## 2018-10-30 DIAGNOSIS — Z9181 History of falling: Secondary | ICD-10-CM | POA: Diagnosis not present

## 2018-10-30 DIAGNOSIS — R600 Localized edema: Secondary | ICD-10-CM | POA: Diagnosis not present

## 2018-10-30 DIAGNOSIS — R0600 Dyspnea, unspecified: Secondary | ICD-10-CM | POA: Diagnosis not present

## 2018-11-14 DIAGNOSIS — Z79899 Other long term (current) drug therapy: Secondary | ICD-10-CM | POA: Diagnosis not present

## 2018-11-14 DIAGNOSIS — D649 Anemia, unspecified: Secondary | ICD-10-CM | POA: Diagnosis not present

## 2018-11-14 DIAGNOSIS — G8929 Other chronic pain: Secondary | ICD-10-CM | POA: Diagnosis not present

## 2018-11-14 DIAGNOSIS — R0600 Dyspnea, unspecified: Secondary | ICD-10-CM | POA: Diagnosis not present

## 2018-11-19 ENCOUNTER — Encounter: Payer: Self-pay | Admitting: *Deleted

## 2018-11-20 ENCOUNTER — Encounter: Payer: Self-pay | Admitting: Cardiology

## 2018-11-20 ENCOUNTER — Encounter: Payer: Self-pay | Admitting: *Deleted

## 2018-11-20 ENCOUNTER — Ambulatory Visit (INDEPENDENT_AMBULATORY_CARE_PROVIDER_SITE_OTHER): Payer: Medicare Other | Admitting: Cardiology

## 2018-11-20 ENCOUNTER — Other Ambulatory Visit: Payer: Self-pay

## 2018-11-20 VITALS — BP 184/82 | HR 60 | Ht 62.0 in | Wt 170.6 lb

## 2018-11-20 DIAGNOSIS — R079 Chest pain, unspecified: Secondary | ICD-10-CM

## 2018-11-20 DIAGNOSIS — I10 Essential (primary) hypertension: Secondary | ICD-10-CM

## 2018-11-20 NOTE — Progress Notes (Signed)
Cardiology Office Note:    Date:  11/20/2018   ID:  Melinda Briggs, DOB [birth year], MRN [id number]  PCP:  Serita Grammes, MD  Cardiologist:  No primary care provider on file.  Electrophysiologist:  None   Referring MD: Serita Grammes, MD    The patient was referred for chest painThe patient was referred for chest pain.  ASSESSMENT:    1. Essential hypertension   2. Chest pain, unspecified type    PLAN:    1. Melinda Briggs reported chest pain is concerning because of her risk factors.  We will get a pharmacologic nuclear stress test.  2. Her blood pressure does not appear to be well controlled.  She currently takes lisinopril 20 mg, hydrochlorothiazide 25 (these were recently started), metoprolol succinate 5 mg daily.  She is very reluctant to add any new antihypertensive as she states that she does have history of whitecoat hypertension.  She is willing to buy a blood pressure monitor system today and will start checking her blood pressure at home.  Of asked patient to take her blood pressure daily and bring this recording 1 week from today when she comes for a follow-up visit.  She was advised to decrease her salt intake.  3. A transthoracic echocardiogram has been ordered to assess LV and RV function, along with structural abnormalities.  History of Present Illness:    Melinda Briggs is a 83 y.o. female with a hx of hypertension, presents to be evaluated for chest pain.  Patient tells me that for the last year she has experienced intermittent chest pain.  She describes this as a chest tightness which is substernal and sometimes radiates to her neck and shoulder.  She notes that it lasts from anywhere to 5 to 10 minutes and quantifies it at a 6 out of 10.  She reports that prior to 3 months ago she only experiences intermittently therefore did not think much of it.  However she notes over the last 3 months she has had significant shortness of breath on exertion.  She notes that  her mailbox is about 3 cars away from her door, and this is a walk that she made every day without any problems to pick up her mail.  Over the last 3 months or sooner she goes to pick up her mail in the middle of her walk to the mailbox she experiences significant shortness of breath on exertion which resolves with rest.  This is not interfering with her daily activity therefore she needed to be evaluated. She is not short of breath or not expressing chest pain the office.   Past Medical History:  Diagnosis Date  . Anemia   . Carotid bruit   . Cystocele with rectocele   . Diverticulosis of colon with hemorrhage   . Dysuria   . Family history of ischemic heart disease (IHD)   . GERD (gastroesophageal reflux disease)   . Glaucoma   . Hematuria, gross   . Hepatitis B   . History of colon polyps   . Hyperlipidemia   . Hypertension   . Non-cardiac chest pain   . Osteopenia   . Rectocele     Past Surgical History:  Procedure Laterality Date  . BREAST REDUCTION SURGERY  1998  . CARDIAC CATHETERIZATION    . COLONOSCOPY  02/11/2012   Moderate to severe sigmoid diverticulosis, small internal hemorrhoids.  Marland Kitchen KNEE ARTHROSCOPY  2004  . KNEE ARTHROSCOPY  2005  . KNEE SURGERY  x3  . LEFT HEART CATH Left 10/13/2009  . UPPER GASTROINTESTINAL ENDOSCOPY  06/18/2012   Presbyesophagus, large hiatal hernia, healed cameron erosion, mild gastritis.    Current Medications: Current Meds  Medication Sig  . acetaminophen (TYLENOL) 500 MG tablet Take 500 mg by mouth every 6 (six) hours as needed.   . bimatoprost (LUMIGAN) 0.03 % ophthalmic solution Place 1 drop into both eyes at bedtime.  . celecoxib (CELEBREX) 200 MG capsule Take 200 mg by mouth daily.  Marland Kitchen esomeprazole (NEXIUM) 20 MG capsule Take 20 mg by mouth daily before breakfast.  . furosemide (LASIX) 20 MG tablet Take 20 mg by mouth daily.  Marland Kitchen lisinopril-hydrochlorothiazide (PRINZIDE,ZESTORETIC) 20-25 MG tablet Take 1 tablet by mouth daily.   . metoprolol succinate (TOPROL-XL) 25 MG 24 hr tablet TAKE 1 TABLET BY MOUTH DAILY     Allergies:   Contrast media [iodinated diagnostic agents]   Social History   Socioeconomic History  . Marital status: Married    Spouse name: Not on file  . Number of children: Not on file  . Years of education: Not on file  . Highest education level: Not on file  Occupational History  . Not on file  Social Needs  . Financial resource strain: Not on file  . Food insecurity    Worry: Not on file    Inability: Not on file  . Transportation needs    Medical: Not on file    Non-medical: Not on file  Tobacco Use  . Smoking status: Never Smoker  . Smokeless tobacco: Never Used  Substance and Sexual Activity  . Alcohol use: Not Currently  . Drug use: Never  . Sexual activity: Not on file  Lifestyle  . Physical activity    Days per week: Not on file    Minutes per session: Not on file  . Stress: Not on file  Relationships  . Social Herbalist on phone: Not on file    Gets together: Not on file    Attends religious service: Not on file    Active member of club or organization: Not on file    Attends meetings of clubs or organizations: Not on file    Relationship status: Not on file  Other Topics Concern  . Not on file  Social History Narrative  . Not on file     Family History: The patient'sfamily history includes Breast cancer in her maternal grandmother; Colon cancer in her paternal grandmother; Heart attack in her father and mother.  ROS:   Review of Systems  Constitution: Negative for decreased appetite, fever and weight gain.  HENT: Negative for congestion, ear discharge, hoarse voice and sore throat.   Eyes: Negative for discharge, redness, vision loss in right eye and visual halos.  Cardiovascular: Reports chest pain, dyspnea on exertion, she denies leg swelling, orthopnea and palpitations.  Respiratory: Negative for cough, hemoptysis, shortness of breath and  snoring.   Endocrine: Negative for heat intolerance and polyphagia.  Hematologic/Lymphatic: Negative for bleeding problem. Does not bruise/bleed easily.  Skin: Negative for flushing, nail changes, rash and suspicious lesions.  Musculoskeletal: Negative for arthritis, joint pain, muscle cramps, myalgias, neck pain and stiffness.  Gastrointestinal: Negative for abdominal pain, bowel incontinence, diarrhea and excessive appetite.  Genitourinary: Negative for decreased libido, genital sores and incomplete emptying.  Neurological: Negative for brief paralysis, focal weakness, headaches and loss of balance.  Psychiatric/Behavioral: Negative for altered mental status, depression and suicidal ideas.  Allergic/Immunologic: Negative for HIV  exposure and persistent infections.    EKGs/Labs/Other Studies Reviewed:    The following studies were reviewed today:   EKG: The ekg ordered today demonstrates sinus rhythm, heart rate 60 bpm, poor R wave progression which could be likely due to septal infarction.  Compared to EKG performed on October 30, 2018 there is no significant changes.  Recent Labs: No results found for requested labs within last 8760 hours.  Recent Lipid Panel No results found for: CHOL, TRIG, HDL, CHOLHDL, VLDL, LDLCALC, LDLDIRECT  Physical Exam:    VS:  BP (!) 184/82 (BP Location: Right Arm, Patient Position: Sitting, Cuff Size: Normal)   Pulse 60   Ht 5\' 2"  (1.575 m)   Wt 170 lb 9.6 oz (77.4 kg)   SpO2 99%   BMI 31.20 kg/m     Wt Readings from Last 3 Encounters:  11/20/18 170 lb 9.6 oz (77.4 kg)  08/08/17 172 lb 8 oz (78.2 kg)     [id number] female, Well nourished, well developed in no acute distress HEENT: Mucous membranes moist, good dentition NECK: No JVD; No carotid bruits LYMPHATICS: No lymphadenopathy CARDIAC: S1S2 noted, RRR, no murmur rubs, gallops RESPIRATORY:  Clear to auscultation without rales, wheezing or rhonchi  ABDOMEN: Soft, non-tender,  non-distended, bowel sounds noted, no guarding EXTREMITIES: Bilateral trace ankle, no cyanosis, no clubbing MUSCULOSKELETAL: No deformity  SKIN: Warm and dry NEUROLOGIC:  Alert and oriented x 3, nonfocal PSYCHIATRIC:  Normal affect, good insight  Medication Adjustments/Labs and Tests Ordered: Current medicines are reviewed at length with the patient today.  Concerns regarding medicines are outlined above.  Orders Placed This Encounter  Procedures  . Basic Metabolic Panel (BMET)  . Magnesium  . MYOCARDIAL PERFUSION IMAGING  . ECHOCARDIOGRAM COMPLETE   No orders of the defined types were placed in this encounter.   Patient Instructions  Medication Instructions:  Your physician recommends that you continue on your current medications as directed. Please refer to the Current Medication list given to you today.  If you need a refill on your cardiac medications before your next appointment, please call your pharmacy.   Lab work: Your physician recommends that you return for lab work in: NEXT WEEK BMP, Magnesium  If you have labs (blood work) drawn today and your tests are completely normal, you will receive your results only by: Marland Kitchen MyChart Message (if you have MyChart) OR . A paper copy in the mail If you have any lab test that is abnormal or we need to change your treatment, we will call you to review the results.  Testing/Procedures: Your physician has requested that you have an echocardiogram. Echocardiography is a painless test that uses sound waves to create images of your heart. It provides your doctor with information about the size and shape of your heart and how well your heart's chambers and valves are working. This procedure takes approximately one hour. There are no restrictions for this procedure.  Your physician has requested that you have a lexiscan myoview. For further information please visit [email or web address]. Please follow instruction sheet, as given.     Follow-Up: At [facility], you and your health needs are our priority.  As part of our continuing mission to provide you with exceptional heart care, we have created designated Provider Care Teams.  These Care Teams include your primary Cardiologist (physician) and Advanced Practice Providers (APPs -  Physician Assistants and Nurse Practitioners) who all work together to provide you with the care you need, when you need it.  You will need a follow up appointment in 1 weeks.  Any Other Special Instructions Will Be Listed Below (If Applicable).      Rolly Pancake, DO  11/20/2018 10:58 PM    [facility]

## 2018-11-20 NOTE — Patient Instructions (Signed)
Medication Instructions:  Your physician recommends that you continue on your current medications as directed. Please refer to the Current Medication list given to you today.  If you need a refill on your cardiac medications before your next appointment, please call your pharmacy.   Lab work: Your physician recommends that you return for lab work in: NEXT WEEK BMP, Magnesium  If you have labs (blood work) drawn today and your tests are completely normal, you will receive your results only by: Marland Kitchen MyChart Message (if you have MyChart) OR . A paper copy in the mail If you have any lab test that is abnormal or we need to change your treatment, we will call you to review the results.  Testing/Procedures: Your physician has requested that you have an echocardiogram. Echocardiography is a painless test that uses sound waves to create images of your heart. It provides your doctor with information about the size and shape of your heart and how well your heart's chambers and valves are working. This procedure takes approximately one hour. There are no restrictions for this procedure.  Your physician has requested that you have a lexiscan myoview. For further information please visit [email or web address]. Please follow instruction sheet, as given.    Follow-Up: At [facility], you and your health needs are our priority.  As part of our continuing mission to provide you with exceptional heart care, we have created designated Provider Care Teams.  These Care Teams include your primary Cardiologist (physician) and Advanced Practice Providers (APPs -  Physician Assistants and Nurse Practitioners) who all work together to provide you with the care you need, when you need it. You will need a follow up appointment in 1 weeks.  Any Other Special Instructions Will Be Listed Below (If Applicable).

## 2018-11-21 ENCOUNTER — Telehealth: Payer: Self-pay

## 2018-11-21 NOTE — Telephone Encounter (Signed)
BUN 44 mg/dL Crea 1.5 mg/dL

## 2018-11-25 ENCOUNTER — Encounter: Payer: Self-pay | Admitting: Cardiology

## 2018-11-25 DIAGNOSIS — R079 Chest pain, unspecified: Secondary | ICD-10-CM | POA: Diagnosis not present

## 2018-11-25 LAB — BASIC METABOLIC PANEL
BUN/Creatinine Ratio: 23 (ref 12–28)
BUN: 34 mg/dL — ABNORMAL HIGH (ref 8–27)
CO2: 22 mmol/L (ref 20–29)
Calcium: 9.9 mg/dL (ref 8.7–10.3)
Chloride: 103 mmol/L (ref 96–106)
Creatinine, Ser: 1.48 mg/dL — ABNORMAL HIGH (ref 0.57–1.00)
GFR calc Af Amer: 38 mL/min/{1.73_m2} — ABNORMAL LOW (ref >59)
GFR calc non Af Amer: 33 mL/min/{1.73_m2} — ABNORMAL LOW (ref >59)
Glucose: 94 mg/dL (ref 65–99)
Potassium: 4.7 mmol/L (ref 3.5–5.2)
Sodium: 139 mmol/L (ref 134–144)

## 2018-11-25 LAB — MAGNESIUM: Magnesium: 2.1 mg/dL (ref 1.6–2.3)

## 2018-11-25 NOTE — Addendum Note (Signed)
Addended by: Susie Cassette on: 11/25/2018 08:33 AM   Modules accepted: Orders

## 2018-11-26 ENCOUNTER — Other Ambulatory Visit: Payer: Self-pay

## 2018-11-26 ENCOUNTER — Ambulatory Visit (INDEPENDENT_AMBULATORY_CARE_PROVIDER_SITE_OTHER): Payer: Medicare Other | Admitting: Cardiology

## 2018-11-26 ENCOUNTER — Encounter: Payer: Self-pay | Admitting: Cardiology

## 2018-11-26 VITALS — BP 150/64 | HR 70 | Ht 62.0 in | Wt 173.2 lb

## 2018-11-26 DIAGNOSIS — R011 Cardiac murmur, unspecified: Secondary | ICD-10-CM | POA: Diagnosis not present

## 2018-11-26 DIAGNOSIS — I10 Essential (primary) hypertension: Secondary | ICD-10-CM

## 2018-11-26 NOTE — Patient Instructions (Signed)
Medication Instructions:  Your physician recommends that you continue on your current medications as directed. Please refer to the Current Medication list given to you today.  If you need a refill on your cardiac medications before your next appointment, please call your pharmacy.   Lab work: None If you have labs (blood work) drawn today and your tests are completely normal, you will receive your results only by: . MyChart Message (if you have MyChart) OR . A paper copy in the mail If you have any lab test that is abnormal or we need to change your treatment, we will call you to review the results.  Testing/Procedures: None  Follow-Up: At [facility], you and your health needs are our priority.  As part of our continuing mission to provide you with exceptional heart care, we have created designated Provider Care Teams.  These Care Teams include your primary Cardiologist (physician) and Advanced Practice Providers (APPs -  Physician Assistants and Nurse Practitioners) who all work together to provide you with the care you need, when you need it. You will need a follow up appointment in 1 months Any Other Special Instructions Will Be Listed Below (If Applicable).    

## 2018-11-26 NOTE — Progress Notes (Addendum)
Cardiology Office Note:    Date:  11/26/2018   ID:  [name], DOB 1936/01/30, MRN [id number]  PCP:  Serita Grammes, MD  Cardiologist:  No primary care provider on file.  Electrophysiologist:  None   Referring MD: Serita Grammes, MD   Follow up visit for blood pressure.  History of Present Illness:    Melinda Briggs is a 83 y.o. female with a hx of hypertension, hyperlipidemia presents for follow-up visit.  I did see patient on November 20, 2018 at which time her blood pressure was elevated but the patient at the time did not want to have any anti-hypertensive added to her medication.  I therefore requested patient take her blood pressure daily and come in for 1 week follow-up for blood pressure.  Past Medical History:  Diagnosis Date  . Anemia   . Carotid bruit   . Cystocele with rectocele   . Diverticulosis of colon with hemorrhage   . Dysuria   . Family history of ischemic heart disease (IHD)   . GERD (gastroesophageal reflux disease)   . Glaucoma   . Hematuria, gross   . Hepatitis B   . History of colon polyps   . Hyperlipidemia   . Hypertension   . Non-cardiac chest pain   . Osteopenia   . Rectocele     Past Surgical History:  Procedure Laterality Date  . BREAST REDUCTION SURGERY  1998  . CARDIAC CATHETERIZATION    . COLONOSCOPY  02/11/2012   Moderate to severe sigmoid diverticulosis, small internal hemorrhoids.  Marland Kitchen KNEE ARTHROSCOPY  2004  . KNEE ARTHROSCOPY  2005  . KNEE SURGERY     x3  . LEFT HEART CATH Left 10/13/2009  . UPPER GASTROINTESTINAL ENDOSCOPY  06/18/2012   Presbyesophagus, large hiatal hernia, healed cameron erosion, mild gastritis.    Current Medications: Current Meds  Medication Sig  . acetaminophen (TYLENOL) 500 MG tablet Take 500 mg by mouth every 6 (six) hours as needed.   . bimatoprost (LUMIGAN) 0.03 % ophthalmic solution Place 1 drop into both eyes at bedtime.  . celecoxib (CELEBREX) 200 MG capsule Take 200 mg by mouth  daily.  Marland Kitchen esomeprazole (NEXIUM) 20 MG capsule Take 20 mg by mouth daily before breakfast.  . furosemide (LASIX) 20 MG tablet Take 20 mg by mouth daily.  Marland Kitchen lisinopril-hydrochlorothiazide (PRINZIDE,ZESTORETIC) 20-25 MG tablet Take 1 tablet by mouth daily.  . metoprolol succinate (TOPROL-XL) 25 MG 24 hr tablet TAKE 1 TABLET BY MOUTH DAILY     Allergies:   Contrast media [iodinated diagnostic agents]   Social History   Socioeconomic History  . Marital status: Married    Spouse name: Not on file  . Number of children: Not on file  . Years of education: Not on file  . Highest education level: Not on file  Occupational History  . Not on file  Social Needs  . Financial resource strain: Not on file  . Food insecurity    Worry: Not on file    Inability: Not on file  . Transportation needs    Medical: Not on file    Non-medical: Not on file  Tobacco Use  . Smoking status: Never Smoker  . Smokeless tobacco: Never Used  Substance and Sexual Activity  . Alcohol use: Not Currently  . Drug use: Never  . Sexual activity: Not on file  Lifestyle  . Physical activity    Days per week: Not on file    Minutes per session: Not  on file  . Stress: Not on file  Relationships  . Social Herbalist on phone: Not on file    Gets together: Not on file    Attends religious service: Not on file    Active member of club or organization: Not on file    Attends meetings of clubs or organizations: Not on file    Relationship status: Not on file  Other Topics Concern  . Not on file  Social History Narrative  . Not on file     Family History: The patient's family history includes Breast cancer in her maternal grandmother; Colon cancer in her paternal grandmother; Heart attack in her father and mother.  ROS:   Review of Systems  Constitution: Negative for decreased appetite, fever and weight gain.  HENT: Negative for congestion, ear discharge, hoarse voice and sore throat.   Eyes:  Negative for discharge, redness, vision loss in right eye and visual halos.  Cardiovascular: Negative for chest pain, dyspnea on exertion, leg swelling, orthopnea and palpitations.  Respiratory: Negative for cough, hemoptysis, shortness of breath and snoring.   Endocrine: Negative for heat intolerance and polyphagia.  Hematologic/Lymphatic: Negative for bleeding problem. Does not bruise/bleed easily.  Skin: Negative for flushing, nail changes, rash and suspicious lesions.  Musculoskeletal: Negative for arthritis, joint pain, muscle cramps, myalgias, neck pain and stiffness.  Gastrointestinal: Negative for abdominal pain, bowel incontinence, diarrhea and excessive appetite.  Genitourinary: Negative for decreased libido, genital sores and incomplete emptying.  Neurological: Negative for brief paralysis, focal weakness, headaches and loss of balance.  Psychiatric/Behavioral: Negative for altered mental status, depression and suicidal ideas.  Allergic/Immunologic: Negative for HIV exposure and persistent infections.    EKGs/Labs/Other Studies Reviewed:    The following studies were reviewed today:   EKG:  The ekg ordered today demonstrates   Recent Labs: 11/25/2018: BUN 34; Creatinine, Ser 1.48; Magnesium 2.1; Potassium 4.7; Sodium 139  Recent Lipid Panel No results found for: CHOL, TRIG, HDL, CHOLHDL, VLDL, LDLCALC, LDLDIRECT  Physical Exam:    VS:  BP (!) 150/64 (BP Location: Left Arm, Patient Position: Sitting, Cuff Size: Normal)   Pulse 70   Ht 5\' 2"  (1.575 m)   Wt 173 lb 3.2 oz (78.6 kg)   SpO2 98%   BMI 31.68 kg/m     Wt Readings from Last 3 Encounters:  11/26/18 173 lb 3.2 oz (78.6 kg)  11/20/18 170 lb 9.6 oz (77.4 kg)  08/08/17 172 lb 8 oz (78.2 kg)     GEN: Well nourished, well developed in no acute distress HEENT: Normal NECK: No JVD; No carotid bruits LYMPHATICS: No lymphadenopathy CARDIAC: S1S2 noted,RRR, 2/6 systolic murmurs, rubs, gallops RESPIRATORY:  Clear to  auscultation without rales, wheezing or rhonchi  ABDOMEN: Soft, non-tender, non-distended, +bowel sounds, no guarding. EXTREMITIES: No edema, No cyanosis, no clubbing MUSCULOSKELETAL:  No edema; No deformity  SKIN: Warm and dry NEUROLOGIC:  Alert and oriented x 3, non-focal PSYCHIATRIC:  Normal affect, good insight  ASSESSMENT:    1. Essential hypertension   2. Heart murmur, systolic    PLAN:    1.  The patient did bring her home blood pressure recordings.  She has been taking it daily and at home it is running between 130s to 140s. Today here in the office her systolic blood pressures [id number].  We will continue to closely monitor patient as she prefers to be off of any additional antihypertensive.  2.  Testing is pending for her echocardiogram and  her pharmacologic nuclear stress test.    3. In terms of her endoscopy and colonoscopy the patient can proceed with her testing.  Please continue hemodynamic monitoring during her procedure.  The patient left the office in stable condition.  The patient will follow up in 1 month.   Medication Adjustments/Labs and Tests Ordered: Current medicines are reviewed at length with the patient today.  Concerns regarding medicines are outlined above.  No orders of the defined types were placed in this encounter.  No orders of the defined types were placed in this encounter.   Patient Instructions  Medication Instructions:  Your physician recommends that you continue on your current medications as directed. Please refer to the Current Medication list given to you today.  If you need a refill on your cardiac medications before your next appointment, please call your pharmacy.   Lab work: None If you have labs (blood work) drawn today and your tests are completely normal, you will receive your results only by: Marland Kitchen MyChart Message (if you have MyChart) OR . A paper copy in the mail If you have any lab test that is abnormal or we need to change your  treatment, we will call you to review the results.  Testing/Procedures: None  Follow-Up: At [facility], you and your health needs are our priority.  As part of our continuing mission to provide you with exceptional heart care, we have created designated Provider Care Teams.  These Care Teams include your primary Cardiologist (physician) and Advanced Practice Providers (APPs -  Physician Assistants and Nurse Practitioners) who all work together to provide you with the care you need, when you need it. You will need a follow up appointment in 1 months.  Any Other Special Instructions Will Be Listed Below (If Applicable).       Adopting a Healthy Lifestyle.  Know what a healthy weight is for you (roughly BMI <25) and aim to maintain this   Aim for 7+ servings of fruits and vegetables daily   65-80+ fluid ounces of water or unsweet tea for healthy kidneys   Limit to max 1 drink of alcohol per day; avoid smoking/tobacco   Limit animal fats in diet for cholesterol and heart health - choose grass fed whenever available   Avoid highly processed foods, and foods high in saturated/trans fats   Aim for low stress - take time to unwind and care for your mental health   Aim for 150 min of moderate intensity exercise weekly for heart health, and weights twice weekly for bone health   Aim for 7-9 hours of sleep daily   When it comes to diets, agreement about the perfect plan isnt easy to find, even among the experts. Experts at the [employer] developed an idea known as the Healthy Eating Plate. Just imagine a plate divided into logical, healthy portions.   The emphasis is on diet quality:   Load up on vegetables and fruits - one-half of your plate: Aim for color and variety, and remember that potatoes dont count.   Go for whole grains - one-quarter of your plate: Whole wheat, barley, wheat berries, quinoa, oats, brown rice, and foods made with them. If you want  pasta, go with whole wheat pasta.   Protein power - one-quarter of your plate: Fish, chicken, beans, and nuts are all healthy, versatile protein sources. Limit red meat.   The diet, however, does go beyond the plate, offering a few other suggestions.  Use healthy plant oils, such as olive, canola, soy, corn, sunflower and peanut. Check the labels, and avoid partially hydrogenated oil, which have unhealthy trans fats.   If youre thirsty, drink water. Coffee and tea are good in moderation, but skip sugary drinks and limit milk and dairy products to one or two daily servings.   The type of carbohydrate in the diet is more important than the amount. Some sources of carbohydrates, such as vegetables, fruits, whole grains, and beans-are healthier than others.   Finally, stay active  Signed, Berniece Salines, DO  11/26/2018 5:15 PM    [facility]

## 2018-11-27 ENCOUNTER — Telehealth: Payer: Self-pay | Admitting: *Deleted

## 2018-11-27 NOTE — Telephone Encounter (Signed)
Patient given detailed instructions per Myocardial Perfusion Study Information Sheet for the test on 12/04/18. Patient notified to arrive 15 minutes early and that it is imperative to arrive on time for appointment to keep from having the test rescheduled.  If you need to cancel or reschedule your appointment, please call the office within 24 hours of your appointment. . Patient verbalized understanding.Melinda Briggs

## 2018-12-01 ENCOUNTER — Other Ambulatory Visit: Payer: Self-pay

## 2018-12-01 ENCOUNTER — Ambulatory Visit (INDEPENDENT_AMBULATORY_CARE_PROVIDER_SITE_OTHER): Payer: Medicare Other | Admitting: Gastroenterology

## 2018-12-01 ENCOUNTER — Encounter: Payer: Self-pay | Admitting: Gastroenterology

## 2018-12-01 VITALS — BP 128/70 | HR 56 | Temp 98.1°F | Ht 62.0 in | Wt 174.0 lb

## 2018-12-01 DIAGNOSIS — D509 Iron deficiency anemia, unspecified: Secondary | ICD-10-CM

## 2018-12-01 MED ORDER — CLENPIQ 10-3.5-12 MG-GM -GM/160ML PO SOLN: 1.0000 | Freq: Once | ORAL | 0 refills | Status: AC

## 2018-12-01 MED ORDER — ESOMEPRAZOLE MAGNESIUM 20 MG PO CPDR: 20.0000 mg | DELAYED_RELEASE_CAPSULE | Freq: Every day | ORAL | 11 refills | Status: AC

## 2018-12-01 NOTE — Patient Instructions (Addendum)
If you are age 81 or older, your body mass index should be between 23-30. Your Body mass index is 31.83 kg/m. If this is out of the aforementioned range listed, please consider follow up with your Primary Care Provider.  If you are age 31 or younger, your body mass index should be between 19-25. Your Body mass index is 31.83 kg/m. If this is out of the aformentioned range listed, please consider follow up with your Primary Care Provider.   We have sent the following medications to your pharmacy for you to pick up at your convenience: Nexium 20 mg   We have given you samples of the following medication to take: Clenpiq  Follow the instructions on the Hemoccult cards and mail them back to [place] when you are finished or you may take them directly to the lab in the basement of the [place] building. We will call you with the results.   You have been scheduled for a colonoscopy. Please follow written instructions given to you at your visit today.  Please pick up your prep supplies at the pharmacy within the next 1-3 days. If you use inhalers (even only as needed), please bring them with you on the day of your procedure. Your physician has requested that you go to www.startemmi.com and enter the access code given to you at your visit today. This web site gives a general overview about your procedure. However, you should still follow specific instructions given to you by our office regarding your preparation for the procedure.  Thank you,  Dr. Jackquline Denmark   .

## 2018-12-01 NOTE — Progress Notes (Signed)
Chief Complaint: Right-sided abdominal pain/flank pain  Referring Provider:  Dr Cathi Roan      ASSESSMENT AND PLAN;   #1.  IDA Hb 9.2, MCV 79 October 30, 2018 with Cr 1.5 GFR 41 ml/min. Neg CT AP 08/2017 except for large HH.  #2. Fatty Liver 06/2017. Inactive hep B.  #3. R Flank/RMQ pain (better) - neg US kidneys 06/2017.  Last colo 02/2012 showing mod-severe sigmoid div, small int hoids. Likely musculoskeletal d/t assoc kyphoscoliosis.  #4. GERD with HH with occ dysphagia, on nexium 20mg  po qd. last EGD 06/2012 showing large HH.  Plan: - Patient is scheduled for cardiac stress test on December 04, 2018 - Proceed with EGD with dil/colon after cardiol WU/clearence. Discussed risks & benefits. (Risks including rare perforation req laparotomy, bleeding after biopsies/polypectomy req blood transfusion, rare chance of missing neoplasms, risks of anesthesia/sedation). Benefits outweigh the risks. Patient agrees to proceed. All the questions were answered. Consent forms given for review. -Nexium 20mg  po qd to continue -Hemoccult cards x 3   HPI:    Melinda Briggs is a 83 y.o. female   Seen as an emergency working.  Hb 11.9 (2019) to 9.2, MCV 79 10/30/2018.  Had creatinine 1.5 with GFR 41 mL/min.  Iron studies showed iron saturation of 10% with TIBC 292.  Normal CMP otherwise.  Normal TSH.  With history of chronic right flank pain lasting over last several years but getting worse lately. Had negative ultrasound and CT Abdo/pelvis for etiology.  Attributed to musculoskeletal etiology due to kyphoscoliosis.She describes this pain as more or less constant, nonradiating, exacerbated occasionally after eating, denies having any significant urinary problems.  No nonsteroidals.  Has been having occasional dysphagia mostly to solids.  No nausea, vomiting, heartburn, regurgitation, odynophagia. No significant diarrhea or constipation.  There is no melena or hematochezia. No unintentional weight loss.   No history of itching, skin lesions, easy bruisability, intake of over-the-counter medications including diet pills, herbal medications, anabolic steroids or Tylenol.  There is no history of blood transfusions, IV drug use or family history of liver disease.  No jaundice dark urine or pale stools.  No history of alcohol abuse.  CT AP with contrast 08/13/2017 1. No acute process or explanation for right-sided pain. 2. Moderate hiatal hernia. 3. Aortic Atherosclerosis (ICD10-I70.0). 4. Right sacral fracture, as described on 03/06/2017. A mild superior endplate compression deformity at L1 is favored to be new since that MRI.  Past Medical History:  Diagnosis Date  . Anemia   . Carotid bruit   . Cystocele with rectocele   . Diverticulosis of colon with hemorrhage   . Dysuria   . Family history of ischemic heart disease (IHD)   . GERD (gastroesophageal reflux disease)   . Glaucoma   . Hematuria, gross   . Hepatitis B   . History of colon polyps   . Hyperlipidemia   . Hypertension   . Non-cardiac chest pain   . Osteopenia   . Rectocele     Past Surgical History:  Procedure Laterality Date  . BREAST REDUCTION SURGERY  1998  . CARDIAC CATHETERIZATION    . COLONOSCOPY  02/11/2012   Moderate to severe sigmoid diverticulosis, small internal hemorrhoids.  Marland Kitchen KNEE ARTHROSCOPY  2004  . KNEE ARTHROSCOPY  2005  . KNEE SURGERY     x3  . LEFT HEART CATH Left 10/13/2009  . UPPER GASTROINTESTINAL ENDOSCOPY  06/18/2012   Presbyesophagus, large hiatal hernia, healed cameron erosion, mild gastritis.  Family History  Problem Relation Age of Onset  . Breast cancer Maternal Grandmother   . Colon cancer Paternal Grandmother   . Heart attack Mother   . Heart attack Father     Social History   Tobacco Use  . Smoking status: Never Smoker  . Smokeless tobacco: Never Used  Substance Use Topics  . Alcohol use: Not Currently  . Drug use: Never    Current Outpatient Medications   Medication Sig Dispense Refill  . acetaminophen (TYLENOL) 500 MG tablet Take 500 mg by mouth every 6 (six) hours as needed.     . bimatoprost (LUMIGAN) 0.03 % ophthalmic solution Place 1 drop into both eyes at bedtime.    . celecoxib (CELEBREX) 200 MG capsule Take 200 mg by mouth daily.    Marland Kitchen esomeprazole (NEXIUM) 20 MG capsule Take 20 mg by mouth daily before breakfast.    . furosemide (LASIX) 20 MG tablet Take 20 mg by mouth daily.    Marland Kitchen lisinopril-hydrochlorothiazide (PRINZIDE,ZESTORETIC) 20-25 MG tablet Take 1 tablet by mouth daily.    . metoprolol succinate (TOPROL-XL) 25 MG 24 hr tablet TAKE 1 TABLET BY MOUTH DAILY     No current facility-administered medications for this visit.     Allergies  Allergen Reactions  . Contrast Media [Iodinated Diagnostic Agents] Hives    Review of Systems:  Constitutional: Denies fever, chills, diaphoresis, appetite change and fatigue.  HEENT: Denies photophobia, eye pain, redness, hearing loss, ear pain, congestion, sore throat, rhinorrhea, sneezing, mouth sores, neck pain, neck stiffness and tinnitus.   Respiratory: Denies SOB, DOE, cough, chest tightness,  and wheezing.   Cardiovascular: Denies chest pain, palpitations and leg swelling.  Genitourinary: Denies dysuria, urgency, frequency, hematuria, flank pain and difficulty urinating.  Musculoskeletal: Has myalgias, back pain, joint swelling, arthralgias and gait problem.  Skin: No rash.  Neurological: Denies dizziness, seizures, syncope, weakness, light-headedness, numbness and headaches. Has back pain Hematological: Denies adenopathy. Easy bruising, personal or family bleeding history  Psychiatric/Behavioral: Has anxiety or depression     Physical Exam:    BP 128/70   Pulse (!) 56   Temp 98.1 F (36.7 C)   Ht 5\' 2"  (1.575 m)   Wt 174 lb (78.9 kg)   BMI 31.83 kg/m  Filed Weights   12/01/18 1410  Weight: 174 lb (78.9 kg)   Constitutional:  Well-developed, in no acute distress.  Psychiatric: Normal mood and affect. Behavior is normal. HEENT: Pupils normal.  Conjunctivae are normal. No scleral icterus. Neck supple.  Cardiovascular: Normal rate, regular rhythm. No edema Pulmonary/chest: Effort normal and breath sounds normal. No wheezing, rales or rhonchi. Abdominal: Soft, nondistended. Nontender. Bowel sounds active throughout. There are no masses palpable. No hepatomegaly. Rectal:  defered Neurological: Alert and oriented to person place and time. Skin: Skin is warm and dry. No rashes noted.    Carmell Austria, MD 12/01/2018, 2:39 PM  Cc: Dr. Cathi Roan

## 2018-12-02 ENCOUNTER — Telehealth: Payer: Self-pay

## 2018-12-02 NOTE — Telephone Encounter (Signed)
Per Dr. Terrial Rhodes recent OV notes, patient is pending echo and nuclear stress test. She follows up on 12/30/18.  Dr. Harriet Masson, can you please address cardiac clearance for EGD and colonoscopy at that visit?  Send to p cv div preop and we can fax a letter, or you can fax your note through the Epic fax function.   I will leave this request in the pool.

## 2018-12-02 NOTE — Telephone Encounter (Signed)
[facility] Pre-operative Risk Assessment     Request for surgical clearance:     Endoscopy Procedure  What type of surgery is being performed?     EGD/Colonoscopy  When is this surgery scheduled?     01/14/19  What type of clearance is required ?   Pharmacy  Are there any medications that need to be held prior to surgery and how long? none  Practice name and name of physician performing surgery?      [facility]  What is your office phone and fax number?      Phone- [phone]  [phone]  Anesthesia type (None, local, MAC, general) ?       MAC

## 2018-12-03 NOTE — Telephone Encounter (Signed)
Informed pt that she has been cleared for Endoscopy. Pt verbalized thanks for the call.

## 2018-12-03 NOTE — Telephone Encounter (Signed)
Hi Melinda Briggs, addended my note. She can move on with her procedure

## 2018-12-03 NOTE — Telephone Encounter (Signed)
   Primary Cardiologist: Berniece Salines, DO  Chart reviewed as part of pre-operative protocol coverage. Dr. Harriet Masson saw this patient on 11/26/18 and cleared her for EGD/colonoscopy.   Therefore, based on ACC/AHA guidelines, the patient would be at acceptable risk for the planned procedure without further cardiovascular testing.   I will route this recommendation to the requesting party via Epic fax function and remove from pre-op pool.  Please call with questions.  Tami Lin Veyda Kaufman, PA 12/03/2018, 9:09 AM

## 2018-12-04 ENCOUNTER — Ambulatory Visit (INDEPENDENT_AMBULATORY_CARE_PROVIDER_SITE_OTHER): Payer: Medicare Other

## 2018-12-04 ENCOUNTER — Other Ambulatory Visit: Payer: Self-pay

## 2018-12-04 VITALS — Ht 62.0 in | Wt 170.0 lb

## 2018-12-04 DIAGNOSIS — R079 Chest pain, unspecified: Secondary | ICD-10-CM | POA: Diagnosis not present

## 2018-12-04 DIAGNOSIS — R0789 Other chest pain: Secondary | ICD-10-CM

## 2018-12-04 LAB — MYOCARDIAL PERFUSION IMAGING
LV dias vol: 57 mL (ref 46–106)
LV sys vol: 17 mL
Peak HR: 97 {beats}/min
Rest HR: 58 {beats}/min
SDS: 2
SRS: 0
SSS: 2
TID: 1.02

## 2018-12-04 MED ORDER — TECHNETIUM TC 99M TETROFOSMIN IV KIT
10.4000 | PACK | Freq: Once | INTRAVENOUS | Status: AC | PRN
  Administered 2018-12-04: 10.4 via INTRAVENOUS

## 2018-12-04 MED ORDER — TECHNETIUM TC 99M TETROFOSMIN IV KIT
32.9000 | PACK | Freq: Once | INTRAVENOUS | Status: AC | PRN
  Administered 2018-12-04: 32.9 via INTRAVENOUS

## 2018-12-04 MED ORDER — REGADENOSON 0.4 MG/5ML IV SOLN
0.4000 mg | Freq: Once | INTRAVENOUS | Status: AC
  Administered 2018-12-04: 0.4 mg via INTRAVENOUS

## 2018-12-05 ENCOUNTER — Telehealth: Payer: Self-pay | Admitting: *Deleted

## 2018-12-05 NOTE — Telephone Encounter (Signed)
Patient called. Left message to return call regarding stress test.

## 2018-12-05 NOTE — Telephone Encounter (Signed)
-----   Message from Berniece Salines, DO sent at 12/05/2018  8:19 AM EDT ----- Please notify patient her stress test was normal.

## 2018-12-06 DIAGNOSIS — S20229A Contusion of unspecified back wall of thorax, initial encounter: Secondary | ICD-10-CM | POA: Diagnosis not present

## 2018-12-15 ENCOUNTER — Other Ambulatory Visit (INDEPENDENT_AMBULATORY_CARE_PROVIDER_SITE_OTHER): Payer: Medicare Other

## 2018-12-15 DIAGNOSIS — D509 Iron deficiency anemia, unspecified: Secondary | ICD-10-CM | POA: Diagnosis not present

## 2018-12-15 LAB — HEMOCCULT SLIDES (X 3 CARDS)
Fecal Occult Blood: NEGATIVE
OCCULT 1: NEGATIVE
OCCULT 2: NEGATIVE
OCCULT 3: NEGATIVE
OCCULT 4: NEGATIVE
OCCULT 5: NEGATIVE

## 2018-12-23 ENCOUNTER — Ambulatory Visit (INDEPENDENT_AMBULATORY_CARE_PROVIDER_SITE_OTHER): Payer: Medicare Other

## 2018-12-23 ENCOUNTER — Other Ambulatory Visit: Payer: Self-pay

## 2018-12-23 DIAGNOSIS — R079 Chest pain, unspecified: Secondary | ICD-10-CM | POA: Diagnosis not present

## 2018-12-23 NOTE — Progress Notes (Signed)
Complete echocardiogram has been performed.  Jimmy Leilyn Frayre RDCS, RVT 

## 2018-12-23 NOTE — Progress Notes (Deleted)
Complete echocardiogram has been performed.  Jimmy Laquenta Whitsell RDCS, RVT 

## 2018-12-25 ENCOUNTER — Telehealth: Payer: Self-pay | Admitting: *Deleted

## 2018-12-25 NOTE — Telephone Encounter (Signed)
Telephone call to patient. Left message  With echo results and to call with any questions.

## 2018-12-25 NOTE — Telephone Encounter (Signed)
-----   Message from Berniece Salines, DO sent at 12/24/2018 11:12 PM EDT ----- Mild regurgitation on echo.

## 2018-12-30 ENCOUNTER — Ambulatory Visit (INDEPENDENT_AMBULATORY_CARE_PROVIDER_SITE_OTHER): Payer: Medicare Other | Admitting: Cardiology

## 2018-12-30 ENCOUNTER — Encounter: Payer: Self-pay | Admitting: Cardiology

## 2018-12-30 ENCOUNTER — Other Ambulatory Visit: Payer: Self-pay

## 2018-12-30 VITALS — BP 150/72 | HR 70 | Ht 62.0 in | Wt 174.6 lb

## 2018-12-30 DIAGNOSIS — R0600 Dyspnea, unspecified: Secondary | ICD-10-CM

## 2018-12-30 DIAGNOSIS — R0789 Other chest pain: Secondary | ICD-10-CM

## 2018-12-30 DIAGNOSIS — I10 Essential (primary) hypertension: Secondary | ICD-10-CM

## 2018-12-30 MED ORDER — FUROSEMIDE 20 MG PO TABS: 20.0000 mg | ORAL_TABLET | Freq: Two times a day (BID) | ORAL | 1 refills | Status: DC

## 2018-12-30 MED ORDER — ISOSORBIDE MONONITRATE ER 30 MG PO TB24: 30.0000 mg | ORAL_TABLET | Freq: Every day | ORAL | 1 refills | Status: DC

## 2018-12-30 NOTE — Progress Notes (Signed)
Cardiology Office Note:    Date:  12/30/2018   ID:  Melinda Briggs, DOB May 30, 1935, MRN [id number]  PCP:  Serita Grammes, MD  Cardiologist:  Berniece Salines, DO  Electrophysiologist:  None   Referring MD: Serita Grammes, MD   The patient presents for a follow up visit.  History of Present Illness:    Melinda Briggs is a 83 y.o. female with a hx of hypertension, hyperlipidemia is here for follow-up visit.  I initially did see the patient on September 20 at which time her blood pressure was elevated.  At that she expressed that she did have whitecoat hypertension and her blood pressure usually is better at home.  Therefore no medication changes were made.  She follow-up a week later for the blood pressures between 130s and 140s at home.  At the time she preferred not to be added on any new antihypertensive.  Therefore she was continued on her metoprolol succinate 25 mg daily, her lisinopril milligrams daily, hydrochlorothiazide 25 g daily and Lasix 20 mg daily.  She was then cleared to proceed with her endoscopy testing.  She still pending endoscopy due to her anemia and GERD.  She tells me she recently had some epigastric pain.  She notes that this pain lasted all day and resolved prior to going to bed around the same time she had taken her Nexium.  She does tell me that she still is short of breath this has not improved.   Past Medical History:  Diagnosis Date  . Anemia   . Carotid bruit   . Cystocele with rectocele   . Diverticulosis of colon with hemorrhage   . Dysuria   . Family history of ischemic heart disease (IHD)   . GERD (gastroesophageal reflux disease)   . Glaucoma   . Hematuria, gross   . Hepatitis B   . History of colon polyps   . Hyperlipidemia   . Hypertension   . Non-cardiac chest pain   . Osteopenia   . Rectocele     Past Surgical History:  Procedure Laterality Date  . BREAST REDUCTION SURGERY  1998  . CARDIAC CATHETERIZATION    . COLONOSCOPY   02/11/2012   Moderate to severe sigmoid diverticulosis, small internal hemorrhoids.  Marland Kitchen KNEE ARTHROSCOPY  2004  . KNEE ARTHROSCOPY  2005  . KNEE SURGERY     x3  . LEFT HEART CATH Left 10/13/2009  . UPPER GASTROINTESTINAL ENDOSCOPY  06/18/2012   Presbyesophagus, large hiatal hernia, healed cameron erosion, mild gastritis.    Current Medications: Current Meds  Medication Sig  . acetaminophen (TYLENOL) 500 MG tablet Take 500 mg by mouth every 6 (six) hours as needed.   . bimatoprost (LUMIGAN) 0.03 % ophthalmic solution Place 1 drop into both eyes at bedtime.  . celecoxib (CELEBREX) 200 MG capsule Take 200 mg by mouth daily.  Marland Kitchen esomeprazole (NEXIUM) 20 MG capsule Take 1 capsule (20 mg total) by mouth daily before breakfast.  . lisinopril-hydrochlorothiazide (PRINZIDE,ZESTORETIC) 20-25 MG tablet Take 1 tablet by mouth daily.  . metoprolol succinate (TOPROL-XL) 25 MG 24 hr tablet TAKE 1 TABLET BY MOUTH DAILY  . [DISCONTINUED] furosemide (LASIX) 20 MG tablet Take 20 mg by mouth daily.     Allergies:   Contrast media [iodinated diagnostic agents]   Social History   Socioeconomic History  . Marital status: Married    Spouse name: Not on file  . Number of children: Not on file  . Years of education: Not on  file  . Highest education level: Not on file  Occupational History  . Not on file  Social Needs  . Financial resource strain: Not on file  . Food insecurity    Worry: Not on file    Inability: Not on file  . Transportation needs    Medical: Not on file    Non-medical: Not on file  Tobacco Use  . Smoking status: Never Smoker  . Smokeless tobacco: Never Used  Substance and Sexual Activity  . Alcohol use: Not Currently  . Drug use: Never  . Sexual activity: Not on file  Lifestyle  . Physical activity    Days per week: Not on file    Minutes per session: Not on file  . Stress: Not on file  Relationships  . Social Herbalist on phone: Not on file    Gets  together: Not on file    Attends religious service: Not on file    Active member of club or organization: Not on file    Attends meetings of clubs or organizations: Not on file    Relationship status: Not on file  Other Topics Concern  . Not on file  Social History Narrative  . Not on file     Family History: The patient's family history includes Breast cancer in her maternal grandmother; Colon cancer in her paternal grandmother; Heart attack in her father and mother.  ROS:   Review of Systems  Constitution: Negative for decreased appetite, fever and weight gain.  HENT: Negative for congestion, ear discharge, hoarse voice and sore throat.   Eyes: Negative for discharge, redness, vision loss in right eye and visual halos.  Cardiovascular: Negative for chest pain, dyspnea on exertion, leg swelling, orthopnea and palpitations.  Respiratory: Negative for cough, hemoptysis, shortness of breath and snoring.   Endocrine: Negative for heat intolerance and polyphagia.  Hematologic/Lymphatic: Negative for bleeding problem. Does not bruise/bleed easily.  Skin: Negative for flushing, nail changes, rash and suspicious lesions.  Musculoskeletal: Negative for arthritis, joint pain, muscle cramps, myalgias, neck pain and stiffness.  Gastrointestinal: Negative for abdominal pain, bowel incontinence, diarrhea and excessive appetite.  Genitourinary: Negative for decreased libido, genital sores and incomplete emptying.  Neurological: Negative for brief paralysis, focal weakness, headaches and loss of balance.  Psychiatric/Behavioral: Negative for altered mental status, depression and suicidal ideas.  Allergic/Immunologic: Negative for HIV exposure and persistent infections.    EKGs/Labs/Other Studies Reviewed:    The following studies were reviewed today:  EKG: None today.  Transthoracic echocardiogramIMPRESSIONS 12/23/2018  1. Left ventricular ejection fraction, by visual estimation, is 60 to  65%. The left ventricle has normal function. Normal left ventricular size. There is mildly increased left ventricular hypertrophy.  2. Left ventricular diastolic Doppler parameters are consistent with pseudonormalization pattern of LV diastolic filling.  3. Global right ventricle has normal systolic function.The right ventricular size is normal. No increase in right ventricular wall thickness.  4. Left atrial size was mildly dilated.  5. Right atrial size was normal.  6. The mitral valve is normal in structure. Mild mitral valve regurgitation. No evidence of mitral stenosis.  7. The tricuspid valve is normal in structure. Tricuspid valve regurgitation is trivial.  8. The aortic valve is normal in structure. Aortic valve regurgitation is mild by color flow Doppler. Structurally normal aortic valve, with no evidence of sclerosis or stenosis.  9. The pulmonic valve was normal in structure. Pulmonic valve regurgitation is not visualized by color flow Doppler.  10. Normal pulmonary artery systolic pressure. 11. The inferior vena cava is normal in size with greater than 50% respiratory variability, suggesting right atrial pressure of 3 mmHg.   Pharmacologic stress test  The left ventricular ejection fraction is hyperdynamic (>65%).  Nuclear stress EF: 70%.  There was no ST segment deviation noted during stress.  No T wave inversion was noted during stress.  Defect 1: There is a small defect of mild severity present in the apical anterior and apical septal location. This defect is nonreversible with normal wall motion.  The study is normal. The fixed defect described above is likely due to soft tissue attenuation.  This is a low risk study.  Recent Labs: 11/25/2018: BUN 34; Creatinine, Ser 1.48; Magnesium 2.1; Potassium 4.7; Sodium 139  Recent Lipid Panel No results found for: CHOL, TRIG, HDL, CHOLHDL, VLDL, LDLCALC, LDLDIRECT  Physical Exam:    VS:  BP (!) 150/72 (BP Location: Right Arm)    Pulse 70   Ht 5\' 2"  (1.575 m)   Wt 174 lb 9.6 oz (79.2 kg)   SpO2 98%   BMI 31.93 kg/m     Wt Readings from Last 3 Encounters:  12/30/18 174 lb 9.6 oz (79.2 kg)  12/04/18 170 lb (77.1 kg)  12/01/18 174 lb (78.9 kg)     GEN: Well nourished, well developed in no acute distress HEENT: Normal NECK: No JVD; No carotid bruits LYMPHATICS: No lymphadenopathy CARDIAC: S1S2 noted,RRR, no murmurs, rubs, gallops RESPIRATORY:  Clear to auscultation without rales, wheezing or rhonchi  ABDOMEN: Soft, non-tender, non-distended, +bowel sounds, no guarding. EXTREMITIES: No edema, No cyanosis, no clubbing MUSCULOSKELETAL:  No edema; No deformity  SKIN: Warm and dry NEUROLOGIC:  Alert and oriented x 3, non-focal PSYCHIATRIC:  Normal affect, good insight  ASSESSMENT:    1. Essential hypertension   2. Dyspnea, unspecified type   3. Other chest pain    PLAN:    1.  Chest pain/epigastric pain-she continues to have epigastric pain.  Advised her to continue to take her PPIs.  In addition she does have a planned endoscopy coming up soon.  Her pharmacologic nuclear stress test was unremarkable.  I am going to do a trial of Imdur 30 mg daily and see if this helps the patient's chest pain.    2.  Shortness of breath-echo done shows evidence of diastolic dysfunction therefore this time point increase her diuretics from 20 mg daily to 20 mg twice daily. I am hoping that she can get some relief with this.    3.  Hypertension-she is elevated today manually at 150/72 primary care.  Medication changes with increasing her Lasix as well as adding Imdur 30 mg daily, both of which may impact her blood pressure therefore we will continue to monitor this.  4.  Blood work will be performed today.  Includes CBC, BMP and mag.  The patient is in agreement with the above plan. The patient left the office in stable condition.  The patient will follow up in 4 weeks.   Medication Adjustments/Labs and Tests Ordered:  Current medicines are reviewed at length with the patient today.  Concerns regarding medicines are outlined above.  Orders Placed This Encounter  Procedures  . CBC  . Lipid Profile  . Basic Metabolic Panel (BMET)  . Magnesium   Meds ordered this encounter  Medications  . isosorbide mononitrate (IMDUR) 30 MG 24 hr tablet    Sig: Take 1 tablet (30 mg total) by mouth daily.  Dispense:  90 tablet    Refill:  1  . furosemide (LASIX) 20 MG tablet    Sig: Take 1 tablet (20 mg total) by mouth 2 (two) times daily.    Dispense:  180 tablet    Refill:  1    Patient Instructions  Medication Instructions:  Your physician has recommended you make the following change in your medication:   INCREASE: Furosemide 20 mg Take 1 twice daily  START: Imdur (isosorbide) 30 mg Take 1 tab daily  *If you need a refill on your cardiac medications before your next appointment, please call your pharmacy*  Lab Work: Your physician recommends that you return for lab work TODAY CBC,BMP,Lipid, Magnesium  If you have labs (blood work) drawn today and your tests are completely normal, you will receive your results only by: Marland Kitchen MyChart Message (if you have MyChart) OR . A paper copy in the mail If you have any lab test that is abnormal or we need to change your treatment, we will call you to review the results.  Testing/Procedures: None  Follow-Up: At [facility], you and your health needs are our priority.  As part of our continuing mission to provide you with exceptional heart care, we have created designated Provider Care Teams.  These Care Teams include your primary Cardiologist (physician) and Advanced Practice Providers (APPs -  Physician Assistants and Nurse Practitioners) who all work together to provide you with the care you need, when you need it.  Your next appointment:   1 month  The format for your next appointment:   In Person  Provider:   Berniece Salines, DO  Other Instructions   Isosorbide Mononitrate extended-release tablets What is this medicine? ISOSORBIDE MONONITRATE (eye soe SOR bide mon oh NYE trate) is a vasodilator. It relaxes blood vessels, increasing the blood and oxygen supply to your heart. This medicine is used to prevent chest pain caused by angina. It will not help to stop an episode of chest pain. This medicine may be used for other purposes; ask your health care provider or pharmacist if you have questions. COMMON BRAND NAME(S): Imdur, Isotrate ER What should I tell my health care provider before I take this medicine? They need to know if you have any of these conditions:  previous heart attack or heart failure  an unusual or allergic reaction to isosorbide mononitrate, nitrates, other medicines, foods, dyes, or preservatives  pregnant or trying to get pregnant  breast-feeding How should I use this medicine? Take this medicine by mouth with a glass of water. Follow the directions on the prescription label. Do not crush or chew. Take your medicine at regular intervals. Do not take your medicine more often than directed. Do not stop taking this medicine except on the advice of your doctor or health care professional. Talk to your pediatrician regarding the use of this medicine in children. Special care may be needed. Overdosage: If you think you have taken too much of this medicine contact a poison control center or emergency room at once. NOTE: This medicine is only for you. Do not share this medicine with others. What if I miss a dose? If you miss a dose, take it as soon as you can. If it is almost time for your next dose, take only that dose. Do not take double or extra doses. What may interact with this medicine? Do not take this medicine with any of the following medications:  medicines used to treat erectile dysfunction (ED) like  avanafil, sildenafil, tadalafil, and vardenafil  riociguat This medicine may also interact with the following  medications:  medicines for high blood pressure  other medicines for angina or heart failure This list may not describe all possible interactions. Give your health care provider a list of all the medicines, herbs, non-prescription drugs, or dietary supplements you use. Also tell them if you smoke, drink alcohol, or use illegal drugs. Some items may interact with your medicine. What should I watch for while using this medicine? Check your heart rate and blood pressure regularly while you are taking this medicine. Ask your doctor or health care professional what your heart rate and blood pressure should be and when you should contact him or her. Tell your doctor or health care professional if you feel your medicine is no longer working. You may get dizzy. Do not drive, use machinery, or do anything that needs mental alertness until you know how this medicine affects you. To reduce the risk of dizzy or fainting spells, do not sit or stand up quickly, especially if you are an older patient. Alcohol can make you more dizzy, and increase flushing and rapid heartbeats. Avoid alcoholic drinks. Do not treat yourself for coughs, colds, or pain while you are taking this medicine without asking your doctor or health care professional for advice. Some ingredients may increase your blood pressure. What side effects may I notice from receiving this medicine? Side effects that you should report to your doctor or health care professional as soon as possible:  bluish discoloration of lips, fingernails, or palms of hands  irregular heartbeat, palpitations  low blood pressure  nausea, vomiting  persistent headache  unusually weak or tired Side effects that usually do not require medical attention (report to your doctor or health care professional if they continue or are bothersome):  flushing of the face or neck  rash This list may not describe all possible side effects. Call your doctor for medical advice  about side effects. You may report side effects to FDA at 1-800-FDA-1088. Where should I keep my medicine? Keep out of the reach of children. Store between 15 and 30 degrees C (59 and 86 degrees F). Keep container tightly closed. Throw away any unused medicine after the expiration date. NOTE: This sheet is a summary. It may not cover all possible information. If you have questions about this medicine, talk to your doctor, pharmacist, or health care provider.  2020 Elsevier/Gold Standard (2012-12-19 14:48:19)       Adopting a Healthy Lifestyle.  Know what a healthy weight is for you (roughly BMI <25) and aim to maintain this   Aim for 7+ servings of fruits and vegetables daily   65-80+ fluid ounces of water or unsweet tea for healthy kidneys   Limit to max 1 drink of alcohol per day; avoid smoking/tobacco   Limit animal fats in diet for cholesterol and heart health - choose grass fed whenever available   Avoid highly processed foods, and foods high in saturated/trans fats   Aim for low stress - take time to unwind and care for your mental health   Aim for 150 min of moderate intensity exercise weekly for heart health, and weights twice weekly for bone health   Aim for 7-9 hours of sleep daily   When it comes to diets, agreement about the perfect plan isnt easy to find, even among the experts. Experts at the [employer] developed an idea known as the [employer]  Plate. Just imagine a plate divided into logical, healthy portions.   The emphasis is on diet quality:   Load up on vegetables and fruits - one-half of your plate: Aim for color and variety, and remember that potatoes dont count.   Go for whole grains - one-quarter of your plate: Whole wheat, barley, wheat berries, quinoa, oats, brown rice, and foods made with them. If you want pasta, go with whole wheat pasta.   Protein power - one-quarter of your plate: Fish, chicken, beans, and nuts are all  healthy, versatile protein sources. Limit red meat.   The diet, however, does go beyond the plate, offering a few other suggestions.   Use healthy plant oils, such as olive, canola, soy, corn, sunflower and peanut. Check the labels, and avoid partially hydrogenated oil, which have unhealthy trans fats.   If youre thirsty, drink water. Coffee and tea are good in moderation, but skip sugary drinks and limit milk and dairy products to one or two daily servings.   The type of carbohydrate in the diet is more important than the amount. Some sources of carbohydrates, such as vegetables, fruits, whole grains, and beans-are healthier than others.   Finally, stay active  Signed, Berniece Salines, DO  12/30/2018 9:27 PM    [place]

## 2018-12-30 NOTE — Patient Instructions (Signed)
Medication Instructions:  Your physician has recommended you make the following change in your medication:   INCREASE: Furosemide 20 mg Take 1 twice daily  START: Imdur (isosorbide) 30 mg Take 1 tab daily  *If you need a refill on your cardiac medications before your next appointment, please call your pharmacy*  Lab Work: Your physician recommends that you return for lab work TODAY CBC,BMP,Lipid, Magnesium  If you have labs (blood work) drawn today and your tests are completely normal, you will receive your results only by: Marland Kitchen MyChart Message (if you have MyChart) OR . A paper copy in the mail If you have any lab test that is abnormal or we need to change your treatment, we will call you to review the results.  Testing/Procedures: None  Follow-Up: At [facility], you and your health needs are our priority.  As part of our continuing mission to provide you with exceptional heart care, we have created designated Provider Care Teams.  These Care Teams include your primary Cardiologist (physician) and Advanced Practice Providers (APPs -  Physician Assistants and Nurse Practitioners) who all work together to provide you with the care you need, when you need it.  Your next appointment:   1 month  The format for your next appointment:   In Person  Provider:   Berniece Salines, DO  Other Instructions  Isosorbide Mononitrate extended-release tablets What is this medicine? ISOSORBIDE MONONITRATE (eye soe SOR bide mon oh NYE trate) is a vasodilator. It relaxes blood vessels, increasing the blood and oxygen supply to your heart. This medicine is used to prevent chest pain caused by angina. It will not help to stop an episode of chest pain. This medicine may be used for other purposes; ask your health care provider or pharmacist if you have questions. COMMON BRAND NAME(S): Imdur, Isotrate ER What should I tell my health care provider before I take this medicine? They need to know if you have  any of these conditions:  previous heart attack or heart failure  an unusual or allergic reaction to isosorbide mononitrate, nitrates, other medicines, foods, dyes, or preservatives  pregnant or trying to get pregnant  breast-feeding How should I use this medicine? Take this medicine by mouth with a glass of water. Follow the directions on the prescription label. Do not crush or chew. Take your medicine at regular intervals. Do not take your medicine more often than directed. Do not stop taking this medicine except on the advice of your doctor or health care professional. Talk to your pediatrician regarding the use of this medicine in children. Special care may be needed. Overdosage: If you think you have taken too much of this medicine contact a poison control center or emergency room at once. NOTE: This medicine is only for you. Do not share this medicine with others. What if I miss a dose? If you miss a dose, take it as soon as you can. If it is almost time for your next dose, take only that dose. Do not take double or extra doses. What may interact with this medicine? Do not take this medicine with any of the following medications:  medicines used to treat erectile dysfunction (ED) like avanafil, sildenafil, tadalafil, and vardenafil  riociguat This medicine may also interact with the following medications:  medicines for high blood pressure  other medicines for angina or heart failure This list may not describe all possible interactions. Give your health care provider a list of all the medicines, herbs, non-prescription drugs,  or dietary supplements you use. Also tell them if you smoke, drink alcohol, or use illegal drugs. Some items may interact with your medicine. What should I watch for while using this medicine? Check your heart rate and blood pressure regularly while you are taking this medicine. Ask your doctor or health care professional what your heart rate and blood  pressure should be and when you should contact him or her. Tell your doctor or health care professional if you feel your medicine is no longer working. You may get dizzy. Do not drive, use machinery, or do anything that needs mental alertness until you know how this medicine affects you. To reduce the risk of dizzy or fainting spells, do not sit or stand up quickly, especially if you are an older patient. Alcohol can make you more dizzy, and increase flushing and rapid heartbeats. Avoid alcoholic drinks. Do not treat yourself for coughs, colds, or pain while you are taking this medicine without asking your doctor or health care professional for advice. Some ingredients may increase your blood pressure. What side effects may I notice from receiving this medicine? Side effects that you should report to your doctor or health care professional as soon as possible:  bluish discoloration of lips, fingernails, or palms of hands  irregular heartbeat, palpitations  low blood pressure  nausea, vomiting  persistent headache  unusually weak or tired Side effects that usually do not require medical attention (report to your doctor or health care professional if they continue or are bothersome):  flushing of the face or neck  rash This list may not describe all possible side effects. Call your doctor for medical advice about side effects. You may report side effects to FDA at 1-800-FDA-1088. Where should I keep my medicine? Keep out of the reach of children. Store between 15 and 30 degrees C (59 and 86 degrees F). Keep container tightly closed. Throw away any unused medicine after the expiration date. NOTE: This sheet is a summary. It may not cover all possible information. If you have questions about this medicine, talk to your doctor, pharmacist, or health care provider.  2020 Elsevier/Gold Standard (2012-12-19 14:48:19)

## 2018-12-31 ENCOUNTER — Encounter: Payer: Self-pay | Admitting: Gastroenterology

## 2018-12-31 LAB — BASIC METABOLIC PANEL
BUN/Creatinine Ratio: 25 (ref 12–28)
BUN: 33 mg/dL — ABNORMAL HIGH (ref 8–27)
CO2: 24 mmol/L (ref 20–29)
Calcium: 9.4 mg/dL (ref 8.7–10.3)
Chloride: 104 mmol/L (ref 96–106)
Creatinine, Ser: 1.34 mg/dL — ABNORMAL HIGH (ref 0.57–1.00)
GFR calc Af Amer: 43 mL/min/{1.73_m2} — ABNORMAL LOW (ref >59)
GFR calc non Af Amer: 37 mL/min/{1.73_m2} — ABNORMAL LOW (ref >59)
Glucose: 91 mg/dL (ref 65–99)
Potassium: 4.5 mmol/L (ref 3.5–5.2)
Sodium: 140 mmol/L (ref 134–144)

## 2018-12-31 LAB — LIPID PANEL
Chol/HDL Ratio: 4.2 ratio (ref 0.0–4.4)
Cholesterol, Total: 171 mg/dL (ref 100–199)
HDL: 41 mg/dL (ref >39)
LDL Chol Calc (NIH): 99 mg/dL (ref 0–99)
Triglycerides: 176 mg/dL — ABNORMAL HIGH (ref 0–149)
VLDL Cholesterol Cal: 31 mg/dL (ref 5–40)

## 2018-12-31 LAB — CBC
Hematocrit: 29.4 % — ABNORMAL LOW (ref 34.0–46.6)
Hemoglobin: 8.9 g/dL — ABNORMAL LOW (ref 11.1–15.9)
MCH: 23.8 pg — ABNORMAL LOW (ref 26.6–33.0)
MCHC: 30.3 g/dL — ABNORMAL LOW (ref 31.5–35.7)
MCV: 79 fL (ref 79–97)
Platelets: 365 10*3/uL (ref 150–450)
RBC: 3.74 x10E6/uL — ABNORMAL LOW (ref 3.77–5.28)
RDW: 15 % (ref 11.7–15.4)
WBC: 5.5 10*3/uL (ref 3.4–10.8)

## 2018-12-31 LAB — MAGNESIUM: Magnesium: 2.2 mg/dL (ref 1.6–2.3)

## 2019-01-01 ENCOUNTER — Telehealth: Payer: Self-pay | Admitting: Cardiology

## 2019-01-01 NOTE — Telephone Encounter (Signed)
Patient called and she is concerned that her puls eis high and that she may should not take the Isosorbide.. she felt bad on it.Marland Kitchen

## 2019-01-02 NOTE — Telephone Encounter (Signed)
Telephone call to patient.  States heart rate was running 111-115 for 2 days but was not taking the isosorbide. States feels much better today with heart rate in the 70's. Instructed to continue the isosorbide and call back if heart rate starts racing again.Pt verbalized understanding.

## 2019-01-14 ENCOUNTER — Ambulatory Visit (AMBULATORY_SURGERY_CENTER): Payer: Medicare Other | Admitting: Gastroenterology

## 2019-01-14 ENCOUNTER — Other Ambulatory Visit: Payer: Self-pay

## 2019-01-14 ENCOUNTER — Encounter: Payer: Self-pay | Admitting: Certified Registered Nurse Anesthetist

## 2019-01-14 VITALS — BP 127/46 | HR 60 | Temp 97.8°F | Resp 12 | Ht 62.0 in | Wt 174.0 lb

## 2019-01-14 DIAGNOSIS — D120 Benign neoplasm of cecum: Secondary | ICD-10-CM | POA: Diagnosis not present

## 2019-01-14 DIAGNOSIS — K3189 Other diseases of stomach and duodenum: Secondary | ICD-10-CM

## 2019-01-14 DIAGNOSIS — K5730 Diverticulosis of large intestine without perforation or abscess without bleeding: Secondary | ICD-10-CM

## 2019-01-14 DIAGNOSIS — K449 Diaphragmatic hernia without obstruction or gangrene: Secondary | ICD-10-CM

## 2019-01-14 DIAGNOSIS — K5710 Diverticulosis of small intestine without perforation or abscess without bleeding: Secondary | ICD-10-CM | POA: Diagnosis not present

## 2019-01-14 DIAGNOSIS — K648 Other hemorrhoids: Secondary | ICD-10-CM | POA: Diagnosis not present

## 2019-01-14 DIAGNOSIS — D509 Iron deficiency anemia, unspecified: Secondary | ICD-10-CM

## 2019-01-14 MED ORDER — SODIUM CHLORIDE 0.9 % IV SOLN: 500.0000 mL | INTRAVENOUS | Status: DC

## 2019-01-14 NOTE — Progress Notes (Signed)
Temp JB  V/S CW  I have reviewed the patient's medical history in detail and updated the computerized patient record. 

## 2019-01-14 NOTE — Patient Instructions (Signed)
HANDOUTS PROVIDED ON: HIATAL HERNIA, POLYPS, DIVERTICULOSIS, & HEMORRHOIDS   THE POLYP & BIOPSIES TAKEN TODAY HAVE BEEN SENT FOR PATHOLOGY.  THE RESULTS CAN TAKE 2-3 WEEKS TO RECEIVE.    YOU MAY RESUME YOUR PREVIOUS DIET AND MEDICATION SCHEDULE.  PLEASE CONTINUE TAKING 20 mg OF NEXIUM BY MOUTH ONCE A DAY.  RETURN TO GI CLINIC IN 6 WEEKS.   [place] YOU FOR ALLOWING [place] TO CARE FOR YOU TODAY!!!  YOU HAD AN ENDOSCOPIC PROCEDURE TODAY AT [facility]:   Refer to the procedure report that was given to you for any specific questions about what was found during the examination.  If the procedure report does not answer your questions, please call your gastroenterologist to clarify.  If you requested that your care partner not be given the details of your procedure findings, then the procedure report has been included in a sealed envelope for you to review at your convenience later.  YOU SHOULD EXPECT: Some feelings of bloating in the abdomen. Passage of more gas than usual.  Walking can help get rid of the air that was put into your GI tract during the procedure and reduce the bloating. If you had a lower endoscopy (such as a colonoscopy or flexible sigmoidoscopy) you may notice spotting of blood in your stool or on the toilet paper. If you underwent a bowel prep for your procedure, you may not have a normal bowel movement for a few days.  Please Note:  You might notice some irritation and congestion in your nose or some drainage.  This is from the oxygen used during your procedure.  There is no need for concern and it should clear up in a day or so.  SYMPTOMS TO REPORT IMMEDIATELY:   Following lower endoscopy (colonoscopy or flexible sigmoidoscopy):  Excessive amounts of blood in the stool  Significant tenderness or worsening of abdominal pains  Swelling of the abdomen that is new, acute  Fever of 100F or higher   Following upper endoscopy (EGD)  Vomiting of blood or coffee ground  material  New chest pain or pain under the shoulder blades  Painful or persistently difficult swallowing  New shortness of breath  Fever of 100F or higher  Black, tarry-looking stools  For urgent or emergent issues, a gastroenterologist can be reached at any hour by calling [phone].   DIET:  We do recommend a small meal at first, but then you may proceed to your regular diet.  Drink plenty of fluids but you should avoid alcoholic beverages for 24 hours.  ACTIVITY:  You should plan to take it easy for the rest of today and you should NOT DRIVE or use heavy machinery until tomorrow (because of the sedation medicines used during the test).    FOLLOW UP: Our staff will call the number listed on your records 48-72 hours following your procedure to check on you and address any questions or concerns that you may have regarding the information given to you following your procedure. If we do not reach you, we will leave a message.  We will attempt to reach you two times.  During this call, we will ask if you have developed any symptoms of COVID 19. If you develop any symptoms (ie: fever, flu-like symptoms, shortness of breath, cough etc.) before then, please call [phone].  If you test positive for Covid 19 in the 2 weeks post procedure, please call and report this information to [place].    If any biopsies  were taken you will be contacted by phone or by letter within the next 1-3 weeks.  Please call us at [phone] if you have not heard about the biopsies in 3 weeks.    SIGNATURES/CONFIDENTIALITY: You and/or your care partner have signed paperwork which will be entered into your electronic medical record.  These signatures attest to the fact that that the information above on your After Visit Summary has been reviewed and is understood.  Full responsibility of the confidentiality of this discharge information lies with you and/or your care-partner.

## 2019-01-14 NOTE — Progress Notes (Signed)
Called to room to assist during endoscopic procedure.  Patient ID and intended procedure confirmed with present staff. Received instructions for my participation in the procedure from the performing physician.  

## 2019-01-14 NOTE — Op Note (Signed)
[facility] Patient Name: Melinda Briggs Procedure Date: 01/14/2019 8:45 AM MRN: [id number] Endoscopist: Jackquline Denmark , MD Age: 83 Referring MD:  Date of Birth: Dec 21, 1935 Gender: Female Account #: [id number] Procedure:                Colonoscopy Indications:              Iron deficiency anemia Medicines:                Monitored Anesthesia Care Procedure:                Pre-Anesthesia Assessment:                           - Prior to the procedure, a History and Physical                            was performed, and patient medications and                            allergies were reviewed. The patient's tolerance of                            previous anesthesia was also reviewed. The risks                            and benefits of the procedure and the sedation                            options and risks were discussed with the patient.                            All questions were answered, and informed consent                            was obtained. Prior Anticoagulants: The patient has                            taken no previous anticoagulant or antiplatelet                            agents. ASA Grade Assessment: II - A patient with                            mild systemic disease. After reviewing the risks                            and benefits, the patient was deemed in                            satisfactory condition to undergo the procedure.                           After obtaining informed consent, the colonoscope  was passed under direct vision. Throughout the                            procedure, the patient's blood pressure, pulse, and                            oxygen saturations were monitored continuously. The                            Colonoscope was introduced through the anus and                            advanced to the 2 cm into the ileum. The                            colonoscopy was performed without difficulty. The                             patient tolerated the procedure well. The quality                            of the bowel preparation was adequate to identify                            polyps. Retained solid vegetable material in stool.                            Aggressive suctioning and aspiration was performed.                            Overall over 90% of the colonic mucosa was                            visualized satisfactorily. Of note the smaller flat                            lesions could have been missed. The terminal ileum,                            ileocecal valve, appendiceal orifice, and rectum                            were photographed. Scope In: 9:13:35 AM Scope Out: 9:33:24 AM Scope Withdrawal Time: 0 hours 14 minutes 51 seconds  Total Procedure Duration: 0 hours 19 minutes 49 seconds  Findings:                 A 6 mm polyp was found in the cecum. The polyp was                            sessile. The polyp was removed with a cold biopsy  forceps. Resection and retrieval were complete.                           Multiple small and large-mouthed diverticula were                            found in the sigmoid colon, few in descending colon                            and ascending colon. A single diverticulum was                            noted in the TI.                           Non-bleeding internal hemorrhoids were found during                            retroflexion. The hemorrhoids were small.                           The terminal ileum appeared normal. Complications:            No immediate complications. Estimated Blood Loss:     Estimated blood loss: none. Impression:               - One 6 mm polyp in the cecum, removed with a cold                            biopsy forceps. Resected and retrieved.                           - Moderate to severe predominantly sigmoid                            diverticulosis.                           -  Otherwise normal colonoscopy to TI. Recommendation:           - Patient has a contact number available for                            emergencies. The signs and symptoms of potential                            delayed complications were discussed with the                            patient. Return to normal activities tomorrow.                            Written discharge instructions were provided to the                            patient.                           -  Resume previous diet.                           - Continue present medications.                           - Await pathology results.                           - Repeat colonoscopy only if with any new problems.                           - Return to GI clinic in 6 weeks. Jackquline Denmark, MD 01/14/2019 9:40:33 AM This report has been signed electronically.

## 2019-01-14 NOTE — Progress Notes (Signed)
PT taken to PACU. Monitors in place. VSS. Report given to RN. 

## 2019-01-14 NOTE — Op Note (Signed)
[facility] Patient Name: Melinda Briggs Procedure Date: 01/14/2019 8:46 AM MRN: [id number] Endoscopist: Jackquline Denmark , MD Age: 83 Referring MD:  Date of Birth: 07/24/1935 Gender: Female Account #: [id number] Procedure:                Upper GI endoscopy Indications:              Iron deficiency anemia Medicines:                Monitored Anesthesia Care Procedure:                Pre-Anesthesia Assessment:                           - Prior to the procedure, a History and Physical                            was performed, and patient medications and                            allergies were reviewed. The patient's tolerance of                            previous anesthesia was also reviewed. The risks                            and benefits of the procedure and the sedation                            options and risks were discussed with the patient.                            All questions were answered, and informed consent                            was obtained. Prior Anticoagulants: The patient has                            taken no previous anticoagulant or antiplatelet                            agents. ASA Grade Assessment: II - A patient with                            mild systemic disease. After reviewing the risks                            and benefits, the patient was deemed in                            satisfactory condition to undergo the procedure.                           - Prior to the procedure, a History and Physical  was performed, and patient medications and                            allergies were reviewed. The patient's tolerance of                            previous anesthesia was also reviewed. The risks                            and benefits of the procedure and the sedation                            options and risks were discussed with the patient.                            All questions were answered, and informed  consent                            was obtained. Prior Anticoagulants: The patient has                            taken no previous anticoagulant or antiplatelet                            agents. ASA Grade Assessment: II - A patient with                            mild systemic disease. After reviewing the risks                            and benefits, the patient was deemed in                            satisfactory condition to undergo the procedure.                           After obtaining informed consent, the endoscope was                            passed under direct vision. Throughout the                            procedure, the patient's blood pressure, pulse, and                            oxygen saturations were monitored continuously. The                            Endoscope was introduced through the mouth, and                            advanced to the second part of duodenum. The upper  GI endoscopy was accomplished without difficulty.                            The patient tolerated the procedure well. Scope In: Scope Out: Findings:                 The esophagus was foreshortened and torturous                            mainly in the distal one third of the esophagus.                            Wide open distal esophageal stricture was noted at                            GE junction 29 cm from the incisors.                           A large hiatal hernia was present extending from 29                            cm up to 38 cm (GE junction to diaphragmatic                            hiatus). No [place] erosions.                           A medium-sized 3 cm, submucosal,                            non-circumferential mass with no bleeding and no                            stigmata of recent bleeding was found in the                            gastric body along the anterior wall of the                            stomach. Biopsies were taken with  a cold forceps                            for histology using tunnel technique. The remaining                            gastric mucosa was normal.                           The examined duodenum was normal. Incidental note                            was made of 1 cm duodenal diverticulum in the 2nd  portion of the duodenum. Biopsies were taken with a                            cold forceps for histology. Complications:            No immediate complications. Estimated Blood Loss:     Estimated blood loss: none. Impression:               -Large hiatal hernia.                           -Submucosal gastric lesion (? Lipoma vs leiomyoma                            vs GIST)                           -No active bleeding. Recommendation:           - Patient has a contact number available for                            emergencies. The signs and symptoms of potential                            delayed complications were discussed with the                            patient. Return to normal activities tomorrow.                            Written discharge instructions were provided to the                            patient.                           - Resume previous diet.                           - Continue Nexium 40 mg p.o. once a day.                           - Await pathology results.                           - She would likely need EUS for further evaluation.                           - Return to GI clinic in 6 weeks. Jackquline Denmark, MD 01/14/2019 9:51:48 AM This report has been signed electronically.

## 2019-01-16 ENCOUNTER — Telehealth: Payer: Self-pay

## 2019-01-16 NOTE — Telephone Encounter (Signed)
  Follow up Call-  Call back number 01/14/2019  Post procedure Call Back phone  # [phone]  Permission to leave phone message Yes  Some recent data might be hidden     Patient questions:  Do you have a fever, pain , or abdominal swelling? No. Pain Score  0 *  Have you tolerated food without any problems? Yes.    Have you been able to return to your normal activities? Yes.    Do you have any questions about your discharge instructions: Diet   No. Medications  No. Follow up visit  No.  Do you have questions or concerns about your Care? No.  Actions: * If pain score is 4 or above: No action needed, pain <4.  1. Have you developed a fever since your procedure? no  2.   Have you had an respiratory symptoms (SOB or cough) since your procedure? no  3.   Have you tested positive for COVID 19 since your procedure no  4.   Have you had any family members/close contacts diagnosed with the COVID 19 since your procedure?  no   If yes to any of these questions please route to Joylene John, RN and Alphonsa Gin, Therapist, sports.

## 2019-01-18 ENCOUNTER — Encounter: Payer: Self-pay | Admitting: Gastroenterology

## 2019-01-26 DIAGNOSIS — D649 Anemia, unspecified: Secondary | ICD-10-CM | POA: Diagnosis not present

## 2019-01-26 DIAGNOSIS — R0600 Dyspnea, unspecified: Secondary | ICD-10-CM | POA: Diagnosis not present

## 2019-01-26 DIAGNOSIS — R600 Localized edema: Secondary | ICD-10-CM | POA: Diagnosis not present

## 2019-01-26 DIAGNOSIS — Z23 Encounter for immunization: Secondary | ICD-10-CM | POA: Diagnosis not present

## 2019-01-26 DIAGNOSIS — I130 Hypertensive heart and chronic kidney disease with heart failure and stage 1 through stage 4 chronic kidney disease, or unspecified chronic kidney disease: Secondary | ICD-10-CM | POA: Diagnosis not present

## 2019-01-26 DIAGNOSIS — I10 Essential (primary) hypertension: Secondary | ICD-10-CM | POA: Diagnosis not present

## 2019-02-11 DIAGNOSIS — N1832 Chronic kidney disease, stage 3b: Secondary | ICD-10-CM | POA: Diagnosis not present

## 2019-02-11 DIAGNOSIS — N183 Chronic kidney disease, stage 3 unspecified: Secondary | ICD-10-CM | POA: Diagnosis not present

## 2019-02-11 DIAGNOSIS — I129 Hypertensive chronic kidney disease with stage 1 through stage 4 chronic kidney disease, or unspecified chronic kidney disease: Secondary | ICD-10-CM | POA: Diagnosis not present

## 2019-02-11 DIAGNOSIS — N2581 Secondary hyperparathyroidism of renal origin: Secondary | ICD-10-CM | POA: Diagnosis not present

## 2019-02-11 DIAGNOSIS — D649 Anemia, unspecified: Secondary | ICD-10-CM | POA: Diagnosis not present

## 2019-02-12 ENCOUNTER — Other Ambulatory Visit: Payer: Self-pay | Admitting: Nephrology

## 2019-02-12 DIAGNOSIS — N183 Chronic kidney disease, stage 3 unspecified: Secondary | ICD-10-CM

## 2019-02-16 ENCOUNTER — Ambulatory Visit
Admission: RE | Admit: 2019-02-16 | Discharge: 2019-02-16 | Disposition: A | Discharge status: Home / Self Care | Payer: Medicare Other | Source: Ambulatory Visit | Attending: Nephrology | Admitting: Nephrology

## 2019-02-16 DIAGNOSIS — N183 Chronic kidney disease, stage 3 unspecified: Secondary | ICD-10-CM

## 2019-02-16 DIAGNOSIS — I129 Hypertensive chronic kidney disease with stage 1 through stage 4 chronic kidney disease, or unspecified chronic kidney disease: Secondary | ICD-10-CM | POA: Diagnosis not present

## 2019-02-18 DIAGNOSIS — N183 Chronic kidney disease, stage 3 unspecified: Secondary | ICD-10-CM | POA: Diagnosis not present

## 2019-02-18 DIAGNOSIS — I130 Hypertensive heart and chronic kidney disease with heart failure and stage 1 through stage 4 chronic kidney disease, or unspecified chronic kidney disease: Secondary | ICD-10-CM | POA: Diagnosis not present

## 2019-02-18 DIAGNOSIS — N2581 Secondary hyperparathyroidism of renal origin: Secondary | ICD-10-CM | POA: Diagnosis not present

## 2019-02-18 DIAGNOSIS — R0600 Dyspnea, unspecified: Secondary | ICD-10-CM | POA: Diagnosis not present

## 2019-02-18 DIAGNOSIS — Z0000 Encounter for general adult medical examination without abnormal findings: Secondary | ICD-10-CM | POA: Diagnosis not present

## 2019-03-10 DIAGNOSIS — Z20822 Contact with and (suspected) exposure to covid-19: Secondary | ICD-10-CM | POA: Diagnosis not present

## 2019-03-19 DIAGNOSIS — K219 Gastro-esophageal reflux disease without esophagitis: Secondary | ICD-10-CM | POA: Diagnosis not present

## 2019-03-19 DIAGNOSIS — D649 Anemia, unspecified: Secondary | ICD-10-CM | POA: Diagnosis not present

## 2019-03-19 DIAGNOSIS — I129 Hypertensive chronic kidney disease with stage 1 through stage 4 chronic kidney disease, or unspecified chronic kidney disease: Secondary | ICD-10-CM | POA: Diagnosis not present

## 2019-03-19 DIAGNOSIS — N1832 Chronic kidney disease, stage 3b: Secondary | ICD-10-CM | POA: Diagnosis not present

## 2019-04-03 DIAGNOSIS — Z961 Presence of intraocular lens: Secondary | ICD-10-CM | POA: Diagnosis not present

## 2019-04-04 DIAGNOSIS — D519 Vitamin B12 deficiency anemia, unspecified: Secondary | ICD-10-CM | POA: Diagnosis not present

## 2019-04-04 DIAGNOSIS — K219 Gastro-esophageal reflux disease without esophagitis: Secondary | ICD-10-CM | POA: Diagnosis not present

## 2019-04-04 DIAGNOSIS — I10 Essential (primary) hypertension: Secondary | ICD-10-CM | POA: Diagnosis not present

## 2019-07-07 DIAGNOSIS — R0781 Pleurodynia: Secondary | ICD-10-CM | POA: Diagnosis not present

## 2019-08-21 ENCOUNTER — Other Ambulatory Visit: Payer: Self-pay | Admitting: Cardiology

## 2019-08-21 MED ORDER — ISOSORBIDE MONONITRATE ER 30 MG PO TB24: 30.0000 mg | ORAL_TABLET | Freq: Every day | ORAL | 1 refills | Status: DC

## 2019-08-21 NOTE — Telephone Encounter (Signed)
Returned call to pt.  She is aware that we will send in her refill of Imdur, but she is overdue for an appt, per Dr. Terrial Rhodes last o/v.  Pt was unaware that Dr. Birdie Riddle wanted her back in a month and was apologetic.  Pt did schedule to see her 7/6 at 2:00.  Pt was very grateful for the call.

## 2019-08-21 NOTE — Telephone Encounter (Signed)
°*  STAT* If patient is at the pharmacy, call can be transferred to refill team.   1. Which medications need to be refilled? (please list name of each medication and dose if known) Isosorbide  2. Which pharmacy/location (including street and city if local pharmacy) is medication to be sent to? [facility] on [address]  3. Do they need a 30 day or 90 day supply? 90ays and refills

## 2019-09-08 ENCOUNTER — Ambulatory Visit: Payer: Medicare Other | Admitting: Cardiology

## 2019-09-24 ENCOUNTER — Encounter: Payer: Self-pay | Admitting: Cardiology

## 2019-09-24 ENCOUNTER — Other Ambulatory Visit: Payer: Self-pay

## 2019-09-24 ENCOUNTER — Ambulatory Visit: Payer: Medicare Other | Admitting: Cardiology

## 2019-09-24 VITALS — BP 128/66 | HR 56 | Ht 62.0 in | Wt 175.4 lb

## 2019-09-24 DIAGNOSIS — R600 Localized edema: Secondary | ICD-10-CM

## 2019-09-24 DIAGNOSIS — I10 Essential (primary) hypertension: Secondary | ICD-10-CM

## 2019-09-24 DIAGNOSIS — N183 Chronic kidney disease, stage 3 unspecified: Secondary | ICD-10-CM

## 2019-09-24 MED ORDER — LISINOPRIL-HYDROCHLOROTHIAZIDE 20-25 MG PO TABS: 1.0000 | ORAL_TABLET | Freq: Every day | ORAL | 4 refills | Status: DC

## 2019-09-24 MED ORDER — FUROSEMIDE 40 MG PO TABS: 40.0000 mg | ORAL_TABLET | Freq: Every morning | ORAL | 4 refills | Status: DC

## 2019-09-24 MED ORDER — POTASSIUM CHLORIDE CRYS ER 20 MEQ PO TBCR
20.0000 meq | EXTENDED_RELEASE_TABLET | Freq: Every day | ORAL | 4 refills | Status: DC
Start: 2019-09-24 — End: 2020-10-28

## 2019-09-24 MED ORDER — ISOSORBIDE MONONITRATE ER 30 MG PO TB24: 30.0000 mg | ORAL_TABLET | Freq: Every day | ORAL | 4 refills | Status: DC

## 2019-09-24 MED ORDER — FUROSEMIDE 20 MG PO TABS: 20.0000 mg | ORAL_TABLET | Freq: Every day | ORAL | 4 refills | Status: DC

## 2019-09-24 MED ORDER — METOPROLOL SUCCINATE ER 25 MG PO TB24: 25.0000 mg | ORAL_TABLET | Freq: Every day | ORAL | 4 refills | Status: DC

## 2019-09-24 NOTE — Patient Instructions (Addendum)
Medication Instructions:  Your physician has recommended you make the following change in your medication:  Take 40 mg Lasix every am and 20 mg Lasix every pm Take 20 mEq of potassium chloride daily  *If you need a refill on your cardiac medications before your next appointment, please call your pharmacy*   Lab Work: Your physician recommends that you have labs done in the office today. Your test included  basic metabolic panel, complete blood count, magnesium and lipids.  If you have labs (blood work) drawn today and your tests are completely normal, you will receive your results only by: Marland Kitchen MyChart Message (if you have MyChart) OR . A paper copy in the mail If you have any lab test that is abnormal or we need to change your treatment, we will call you to review the results.   Testing/Procedures: None ordered   Follow-Up: At [facility], you and your health needs are our priority.  As part of our continuing mission to provide you with exceptional heart care, we have created designated Provider Care Teams.  These Care Teams include your primary Cardiologist (physician) and Advanced Practice Providers (APPs -  Physician Assistants and Nurse Practitioners) who all work together to provide you with the care you need, when you need it.  We recommend signing up for the patient portal called "MyChart".  Sign up information is provided on this After Visit Summary.  MyChart is used to connect with patients for Virtual Visits (Telemedicine).  Patients are able to view lab/test results, encounter notes, upcoming appointments, etc.  Non-urgent messages can be sent to your provider as well.   To learn more about what you can do with MyChart, go to [email or web address].    Your next appointment:   12 month(s)  The format for your next appointment:   In Person  Provider:   Berniece Salines, DO   Other Instructions Potassium Chloride Extended-Release Capsules What is this  medicine? POTASSIUM CHLORIDE (poe TASS i um KLOOR ide) is a potassium supplement used to prevent and to treat low potassium. Potassium is important for the heart, muscles, and nerves. Too much or too little potassium in the body can cause serious problems. This medicine may be used for other purposes; ask your health care provider or pharmacist if you have questions. COMMON BRAND NAME(S): Klor-Con, Micro-K, Micro-K Extencaps What should I tell my health care provider before I take this medicine? They need to know if you have any of these conditions:  Addison disease  dehydration  diabetes, high blood sugar  difficulty swallowing  heart disease  high levels of potassium in the blood  irregular heartbeat or rhythm  kidney disease  large areas of burned skin  stomach ulcers, other stomach or intestine problems  an unusual or allergic reaction to potassium, other medicines, foods, dyes, or preservatives  pregnant or trying to get pregnant  breast-feeding How should I use this medicine? Take this drug by mouth with a glass of water. Take it as directed on the prescription label at the same time every day. Take it with food. Do not cut, crush, chew, or suck this drug. Swallow the capsules whole. You may open the capsule and put the contents in a teaspoon of soft food, such as applesauce or pudding. Do not add to hot foods. Swallow the mixture right away. Do not chew the mixture. Drink a glass of water or juice after taking the mixture. Keep taking this medicine unless your health care provider tells you  to stop. Talk to your health care provider about the use of this drug in children. Special care may be needed. Overdosage: If you think you have taken too much of this medicine contact a poison control center or emergency room at once. NOTE: This medicine is only for you. Do not share this medicine with others. What if I miss a dose? If you miss a dose, take it as soon as you can. If  it is almost time for your next dose, take only that dose. Do not take double or extra doses. What may interact with this medicine? Do not take this medicine with any of the following medications:  certain diuretics such as spironolactone, triamterene  certain medicines for stomach problems like atropine; difenoxin and glycopyrrolate  eplerenone  sodium polystyrene sulfonate This medicine may also interact with the following medications:  certain medicines for blood pressure or heart disease like lisinopril, losartan, quinapril, valsartan  medicines that lower your chance of fighting infection such as cyclosporine, tacrolimus  NSAIDs, medicines for pain and inflammation, like ibuprofen or naproxen  other potassium supplements  salt substitutes This list may not describe all possible interactions. Give your health care provider a list of all the medicines, herbs, non-prescription drugs, or dietary supplements you use. Also tell them if you smoke, drink alcohol, or use illegal drugs. Some items may interact with your medicine. What should I watch for while using this medicine? Visit your doctor or health care professional for regular check ups. You will need lab work done regularly. You may need to be on a special diet while taking this medicine. Ask your doctor. What side effects may I notice from receiving this medicine? Side effects that you should report to your doctor or health care professional as soon as possible:  allergic reactions like skin rash, itching or hives, swelling of the face, lips, or tongue  black, tarry stools  breathing problems  confusion  heartburn  fast, irregular heartbeat  feeling faint or lightheaded, falls  low blood pressure  numbness or tingling in hands or feet  pain when swallowing  unusually weak or tired  weakness, heaviness of legs Side effects that usually do not require medical attention (report to your doctor or health care  professional if they continue or are bothersome):  diarrhea  nausea, vomiting  stomach pain This list may not describe all possible side effects. Call your doctor for medical advice about side effects. You may report side effects to FDA at 1-800-FDA-1088. Where should I keep my medicine? Keep out of the reach of children. Store at room temperature between 15 and 30 degrees C (59 and 86 degrees F ). Keep bottle closed tightly to protect this medicine from light and moisture. Throw away any unused medicine after the expiration date. NOTE: This sheet is a summary. It may not cover all possible information. If you have questions about this medicine, talk to your doctor, pharmacist, or health care provider.  2020 Elsevier/Gold Standard (2018-12-16 16:43:28) Furosemide Oral Tablets What is this medicine? FUROSEMIDE (fyoor [place] se mide) is a diuretic. It helps you make more urine and to lose salt and excess water from your body. It treats swelling from heart, kidney, or liver disease. It also treats high blood pressure. This medicine may be used for other purposes; ask your health care provider or pharmacist if you have questions. COMMON BRAND NAME(S): Active-Medicated Specimen Kit, Delone, Diuscreen, Lasix, RX Specimen Collection Kit, Specimen Collection Kit, URINX Medicated Specimen Collection What  should I tell my health care provider before I take this medicine? They need to know if you have any of these conditions:  abnormal blood electrolytes  diarrhea or vomiting  gout  heart disease  kidney disease, small amounts of urine, or difficulty passing urine  liver disease  thyroid disease  an unusual or allergic reaction to furosemide, sulfa drugs, other medicines, foods, dyes, or preservatives  pregnant or trying to get pregnant  breast-feeding How should I use this medicine? Take this drug by mouth. Take it as directed on the prescription label at the same time every day. You can  take it with or without food. If it upsets your stomach, take it with food. Keep taking it unless your health care provider tells you to stop. Talk to your health care provider about the use of this drug in children. Special care may be needed. Overdosage: If you think you have taken too much of this medicine contact a poison control center or emergency room at once. NOTE: This medicine is only for you. Do not share this medicine with others. What if I miss a dose? If you miss a dose, take it as soon as you can. If it is almost time for your next dose, take only that dose. Do not take double or extra doses. What may interact with this medicine?  aspirin and aspirin-like medicines  certain antibiotics  chloral hydrate  cisplatin  cyclosporine  digoxin  diuretics  laxatives  lithium  medicines for blood pressure  medicines that relax muscles for surgery  methotrexate  NSAIDs, medicines for pain and inflammation like ibuprofen, naproxen, or indomethacin  phenytoin  steroid medicines like prednisone or cortisone  sucralfate  thyroid hormones This list may not describe all possible interactions. Give your health care provider a list of all the medicines, herbs, non-prescription drugs, or dietary supplements you use. Also tell them if you smoke, drink alcohol, or use illegal drugs. Some items may interact with your medicine. What should I watch for while using this medicine? Visit your doctor or health care provider for regular checks on your progress. Check your blood pressure regularly. Ask your doctor or health care provider what your blood pressure should be, and when you should contact him or her. If you are a diabetic, check your blood sugar as directed. This medicine may cause serious skin reactions. They can happen weeks to months after starting the medicine. Contact your health care provider right away if you notice fevers or flu-like symptoms with a rash. The rash may  be red or purple and then turn into blisters or peeling of the skin. Or, you might notice a red rash with swelling of the face, lips or lymph nodes in your neck or under your arms. You may need to be on a special diet while taking this medicine. Check with your doctor. Also, ask how many glasses of fluid you need to drink a day. You must not get dehydrated. You may get drowsy or dizzy. Do not drive, use machinery, or do anything that needs mental alertness until you know how this drug affects you. Do not stand or sit up quickly, especially if you are an older patient. This reduces the risk of dizzy or fainting spells. Alcohol can make you more drowsy and dizzy. Avoid alcoholic drinks. This medicine can make you more sensitive to the sun. Keep out of the sun. If you cannot avoid being in the sun, wear protective clothing and use sunscreen. Do  not use sun lamps or tanning beds/booths. What side effects may I notice from receiving this medicine? Side effects that you should report to your doctor or health care professional as soon as possible:  blood in urine or stools  dry mouth  fever or chills  hearing loss or ringing in the ears  irregular heartbeat  muscle pain or weakness, cramps  rash, fever, and swollen lymph nodes  redness, blistering, peeling or loosening of the skin, including inside the mouth  skin rash  stomach upset, pain, or nausea  tingling or numbness in the hands or feet  unusually weak or tired  vomiting or diarrhea  yellowing of the eyes or skin Side effects that usually do not require medical attention (report to your doctor or health care professional if they continue or are bothersome):  headache  loss of appetite  unusual bleeding or bruising This list may not describe all possible side effects. Call your doctor for medical advice about side effects. You may report side effects to FDA at 1-800-FDA-1088. Where should I keep my medicine? Keep out of the  reach of children and pets. Store at room temperature between 20 and 25 degrees C (68 and 77 degrees F). Protect from light and moisture. Keep the container tightly closed. Throw away any unused drug after the expiration date. NOTE: This sheet is a summary. It may not cover all possible information. If you have questions about this medicine, talk to your doctor, pharmacist, or health care provider.  2020 [employer]vier/Gold Standard (2018-10-07 18:01:32)

## 2019-09-24 NOTE — Progress Notes (Signed)
Cardiology Office Note:    Date:  09/24/2019   ID:  Melinda Briggs, DOB 11-10-1935, MRN [id number]  PCP:  Melinda Grammes, MD  Cardiologist:  Melinda Salines, DO  Electrophysiologist:  None   Referring MD: Melinda Grammes, MD   " I am doing well"  History of Present Illness:    Melinda Briggs is a 84 y.o. female with a hx of hypertension, hyperlipidemia presents for the following up visit. I last saw the patient on 12/30/2018 at which time she appears to be doing well from a cardiovascular standpoint.    I initially did see the patient on September 20 at which time her blood pressure was elevated.  At that she expressed that she did have whitecoat hypertension and her blood pressure usually is better at home.  Therefore no medication changes were made.  She follow-up a week later for the blood pressures between 130s and 140s at home.  At the time she preferred not to be added on any new antihypertensive.  Therefore she was continued on her metoprolol succinate 25 mg daily, her lisinopril milligrams daily, hydrochlorothiazide 25 g daily and Lasix 20 mg daily.  Due to concern for epigastric pain and iron deficiency anemia she underwent a endoscopy/ colonoscopy which showed a 3 cm submucosal lesion concerning for lipoma versus leiomyoma.  She also did have a hiatal hernia with tortuous esophagus.  Today she is here for follow-up visit ; She denies chest pain, shortness of breath but tells me that she has been having increasing leg edema  Past Medical History:  Diagnosis Date  . Anemia   . Carotid bruit   . Cystocele with rectocele   . Diverticulosis of colon with hemorrhage   . Dysuria   . Family history of ischemic heart disease (IHD)   . GERD (gastroesophageal reflux disease)   . Glaucoma   . Hematuria, gross   . Hepatitis B   . History of colon polyps   . Hyperlipidemia   . Hypertension   . Non-cardiac chest pain   . Osteopenia   . Rectocele     Past Surgical History:    Procedure Laterality Date  . BREAST REDUCTION SURGERY  1998  . CARDIAC CATHETERIZATION    . COLONOSCOPY  02/11/2012   Moderate to severe sigmoid diverticulosis, small internal hemorrhoids.  Marland Kitchen KNEE ARTHROSCOPY  2004  . KNEE ARTHROSCOPY  2005  . KNEE SURGERY     x3  . LEFT HEART CATH Left 10/13/2009  . UPPER GASTROINTESTINAL ENDOSCOPY  06/18/2012   Presbyesophagus, large hiatal hernia, healed cameron erosion, mild gastritis.    Current Medications: Current Meds  Medication Sig  . acetaminophen (TYLENOL) 500 MG tablet Take 500 mg by mouth every 6 (six) hours as needed.   . bimatoprost (LUMIGAN) 0.03 % ophthalmic solution Place 1 drop into both eyes at bedtime.  . celecoxib (CELEBREX) 200 MG capsule Take 200 mg by mouth daily.  Marland Kitchen esomeprazole (NEXIUM) 20 MG capsule Take 1 capsule (20 mg total) by mouth daily before breakfast.  . ferrous gluconate (FERGON) 324 MG tablet Take 324 mg by mouth daily.  . isosorbide mononitrate (IMDUR) 30 MG 24 hr tablet Take 1 tablet (30 mg total) by mouth daily.  Marland Kitchen lisinopril-hydrochlorothiazide (PRINZIDE,ZESTORETIC) 20-25 MG tablet Take 1 tablet by mouth daily.  Marland Kitchen LUMIGAN 0.01 % SOLN 1 drop at bedtime.  . metoprolol succinate (TOPROL-XL) 25 MG 24 hr tablet TAKE 1 TABLET BY MOUTH DAILY     Allergies:  Contrast media [iodinated diagnostic agents]   Social History   Socioeconomic History  . Marital status: Married    Spouse name: Not on file  . Number of children: Not on file  . Years of education: Not on file  . Highest education level: Not on file  Occupational History  . Not on file  Tobacco Use  . Smoking status: Never Smoker  . Smokeless tobacco: Never Used  Vaping Use  . Vaping Use: Never used  Substance and Sexual Activity  . Alcohol use: Not Currently  . Drug use: Never  . Sexual activity: Not on file  Other Topics Concern  . Not on file  Social History Narrative  . Not on file   Social Determinants of Health   Financial  Resource Strain:   . Difficulty of Paying Living Expenses:   Food Insecurity:   . Worried About Charity fundraiser in the Last Year:   . Arboriculturist in the Last Year:   Transportation Needs:   . Film/video editor (Medical):   Marland Kitchen Lack of Transportation (Non-Medical):   Physical Activity:   . Days of Exercise per Week:   . Minutes of Exercise per Session:   Stress:   . Feeling of Stress :   Social Connections:   . Frequency of Communication with Friends and Family:   . Frequency of Social Gatherings with Friends and Family:   . Attends Religious Services:   . Active Member of Clubs or Organizations:   . Attends Archivist Meetings:   Marland Kitchen Marital Status:      Family History: The patient's family history includes Breast cancer in her maternal grandmother; Colon cancer in her maternal grandmother; Heart attack in her father and mother.  ROS:   Review of Systems  Constitution: Negative for decreased appetite, fever and weight gain.  HENT: Negative for congestion, ear discharge, hoarse voice and sore throat.   Eyes: Negative for discharge, redness, vision loss in right eye and visual halos.  Cardiovascular: Negative for chest pain, dyspnea on exertion, leg swelling, orthopnea and palpitations.  Respiratory: Negative for cough, hemoptysis, shortness of breath and snoring.   Endocrine: Negative for heat intolerance and polyphagia.  Hematologic/Lymphatic: Negative for bleeding problem. Does not bruise/bleed easily.  Skin: Negative for flushing, nail changes, rash and suspicious lesions.  Musculoskeletal: Negative for arthritis, joint pain, muscle cramps, myalgias, neck pain and stiffness.  Gastrointestinal: Negative for abdominal pain, bowel incontinence, diarrhea and excessive appetite.  Genitourinary: Negative for decreased libido, genital sores and incomplete emptying.  Neurological: Negative for brief paralysis, focal weakness, headaches and loss of balance.    Psychiatric/Behavioral: Negative for altered mental status, depression and suicidal ideas.  Allergic/Immunologic: Negative for HIV exposure and persistent infections.    EKGs/Labs/Other Studies Reviewed:    The following studies were reviewed today:   EKG: None today Transthoracic echocardiogramIMPRESSIONS 12/23/2018 1. Left ventricular ejection fraction, by visual estimation, is 60 to 65%. The left ventricle has normal function. Normal left ventricular size. There is mildly increased left ventricular hypertrophy. 2. Left ventricular diastolic Doppler parameters are consistent with pseudonormalization pattern of LV diastolic filling. 3. Global right ventricle has normal systolic function.The right ventricular size is normal. No increase in right ventricular wall thickness. 4. Left atrial size was mildly dilated. 5. Right atrial size was normal. 6. The mitral valve is normal in structure. Mild mitral valve regurgitation. No evidence of mitral stenosis. 7. The tricuspid valve is normal in structure. Tricuspid  valve regurgitation is trivial. 8. The aortic valve is normal in structure. Aortic valve regurgitation is mild by color flow Doppler. Structurally normal aortic valve, with no evidence of sclerosis or stenosis. 9. The pulmonic valve was normal in structure. Pulmonic valve regurgitation is not visualized by color flow Doppler. 10. Normal pulmonary artery systolic pressure. 11. The inferior vena cava is normal in size with greater than 50% respiratory variability, suggesting right atrial pressure of 3 mmHg.   Pharmacologic stress test  The left ventricular ejection fraction is hyperdynamic (>65%).  Nuclear stress EF: 70%.  There was no ST segment deviation noted during stress.  No T wave inversion was noted during stress.  Defect 1: There is a small defect of mild severity present in the apical anterior and apical septal location. This defect is nonreversible with  normal wall motion.  The study is normal. The fixed defect described above is likely due to soft tissue attenuation.  This is a low risk study.  Recent Labs: 12/30/2018: BUN 33; Creatinine, Ser 1.34; Hemoglobin 8.9; Magnesium 2.2; Platelets 365; Potassium 4.5; Sodium 140  Recent Lipid Panel    Component Value Date/Time   CHOL 171 12/30/2018 1137   TRIG 176 (H) 12/30/2018 1137   HDL 41 12/30/2018 1137   CHOLHDL 4.2 12/30/2018 1137   LDLCALC 99 12/30/2018 1137    Physical Exam:    VS:  BP 128/66   Pulse 56   Ht _0  (1.575 m)   Wt 175 lb 6.4 oz (79.6 kg)   SpO2 96%   BMI 32.08 kg/m     Wt Readings from Last 3 Encounters:  09/24/19 175 lb 6.4 oz (79.6 kg)  01/14/19 174 lb (78.9 kg)  12/30/18 174 lb 9.6 oz (79.2 kg)     GEN: Well nourished, well developed in no acute distress HEENT: Normal NECK: No JVD; No carotid bruits LYMPHATICS: No lymphadenopathy CARDIAC: S1S2 noted,RRR, no murmurs, rubs, gallops RESPIRATORY:  Clear to auscultation without rales, wheezing or rhonchi  ABDOMEN: Soft, non-tender, non-distended, +bowel sounds, no guarding. EXTREMITIES: +2 bilateral leg edema, No cyanosis, no clubbing MUSCULOSKELETAL:  No deformity  SKIN: Warm and dry NEUROLOGIC:  Alert and oriented x 3, non-focal PSYCHIATRIC:  Normal affect, good insight  ASSESSMENT:    1. Essential hypertension   2. Bilateral leg edema   3. Stage 3 chronic kidney disease, unspecified whether stage 3a or 3b CKD    PLAN:     1.  She does have some bilateral leg edema in the setting of her chronic kidney disease I am not sure if the Lasix 20 mg twice daily is actually effective.  Some to increase her Lasix to 40 mg in the morning and 20 mg at night.  She is going to continue to weigh herself.  She is not on any potassium supplement and even with the increase Lasix dosage I am going to start low-dose potassium 20 mEq.  We will get blood work today to assess her electrolytes as well as kidney  function.  Blood work will include BMP, mag, CBC.  2.  All of her hypertensive medication will be renewed.  3.  Obesity-the patient understands the need to lose weight with diet and exercise. We have discussed specific strategies for this.  The patient is in agreement with the above plan. The patient left the office in stable condition.  The patient will follow up in   Medication Adjustments/Labs and Tests Ordered: Current medicines are reviewed at length with the  patient today.  Concerns regarding medicines are outlined above.  No orders of the defined types were placed in this encounter.  No orders of the defined types were placed in this encounter.   Patient Instructions  Medication Instructions:  No medication changes. *If you need a refill on your cardiac medications before your next appointment, please call your pharmacy*   Lab Work: Your physician recommends that you have labs done in the office today. Your test included  basic metabolic panel, complete blood count, magnesium and lipids.  If you have labs (blood work) drawn today and your tests are completely normal, you will receive your results only by: Marland Kitchen MyChart Message (if you have MyChart) OR . A paper copy in the mail If you have any lab test that is abnormal or we need to change your treatment, we will call you to review the results.   Testing/Procedures: None ordered   Follow-Up: At [facility], you and your health needs are our priority.  As part of our continuing mission to provide you with exceptional heart care, we have created designated Provider Care Teams.  These Care Teams include your primary Cardiologist (physician) and Advanced Practice Providers (APPs -  Physician Assistants and Nurse Practitioners) who all work together to provide you with the care you need, when you need it.  We recommend signing up for the patient portal called "MyChart".  Sign up information is provided on this After Visit  Summary.  MyChart is used to connect with patients for Virtual Visits (Telemedicine).  Patients are able to view lab/test results, encounter notes, upcoming appointments, etc.  Non-urgent messages can be sent to your provider as well.   To learn more about what you can do with MyChart, go to [email or web address].    Your next appointment:   12 month(s)  The format for your next appointment:   In Person  Provider:   Berniece Salines, DO   Other Instructions Potassium Chloride Extended-Release Capsules What is this medicine? POTASSIUM CHLORIDE (poe TASS i um KLOOR ide) is a potassium supplement used to prevent and to treat low potassium. Potassium is important for the heart, muscles, and nerves. Too much or too little potassium in the body can cause serious problems. This medicine may be used for other purposes; ask your health care provider or pharmacist if you have questions. COMMON BRAND NAME(S): Klor-Con, Micro-K, Micro-K Extencaps What should I tell my health care provider before I take this medicine? They need to know if you have any of these conditions:  Addison disease  dehydration  diabetes, high blood sugar  difficulty swallowing  heart disease  high levels of potassium in the blood  irregular heartbeat or rhythm  kidney disease  large areas of burned skin  stomach ulcers, other stomach or intestine problems  an unusual or allergic reaction to potassium, other medicines, foods, dyes, or preservatives  pregnant or trying to get pregnant  breast-feeding How should I use this medicine? Take this drug by mouth with a glass of water. Take it as directed on the prescription label at the same time every day. Take it with food. Do not cut, crush, chew, or suck this drug. Swallow the capsules whole. You may open the capsule and put the contents in a teaspoon of soft food, such as applesauce or pudding. Do not add to hot foods. Swallow the mixture right away. Do not chew  the mixture. Drink a glass of water or juice after taking the mixture. Keep taking  this medicine unless your health care provider tells you to stop. Talk to your health care provider about the use of this drug in children. Special care may be needed. Overdosage: If you think you have taken too much of this medicine contact a poison control center or emergency room at once. NOTE: This medicine is only for you. Do not share this medicine with others. What if I miss a dose? If you miss a dose, take it as soon as you can. If it is almost time for your next dose, take only that dose. Do not take double or extra doses. What may interact with this medicine? Do not take this medicine with any of the following medications:  certain diuretics such as spironolactone, triamterene  certain medicines for stomach problems like atropine; difenoxin and glycopyrrolate  eplerenone  sodium polystyrene sulfonate This medicine may also interact with the following medications:  certain medicines for blood pressure or heart disease like lisinopril, losartan, quinapril, valsartan  medicines that lower your chance of fighting infection such as cyclosporine, tacrolimus  NSAIDs, medicines for pain and inflammation, like ibuprofen or naproxen  other potassium supplements  salt substitutes This list may not describe all possible interactions. Give your health care provider a list of all the medicines, herbs, non-prescription drugs, or dietary supplements you use. Also tell them if you smoke, drink alcohol, or use illegal drugs. Some items may interact with your medicine. What should I watch for while using this medicine? Visit your doctor or health care professional for regular check ups. You will need lab work done regularly. You may need to be on a special diet while taking this medicine. Ask your doctor. What side effects may I notice from receiving this medicine? Side effects that you should report to your  doctor or health care professional as soon as possible:  allergic reactions like skin rash, itching or hives, swelling of the face, lips, or tongue  black, tarry stools  breathing problems  confusion  heartburn  fast, irregular heartbeat  feeling faint or lightheaded, falls  low blood pressure  numbness or tingling in hands or feet  pain when swallowing  unusually weak or tired  weakness, heaviness of legs Side effects that usually do not require medical attention (report to your doctor or health care professional if they continue or are bothersome):  diarrhea  nausea, vomiting  stomach pain This list may not describe all possible side effects. Call your doctor for medical advice about side effects. You may report side effects to FDA at 1-800-FDA-1088. Where should I keep my medicine? Keep out of the reach of children. Store at room temperature between 15 and 30 degrees C (59 and 86 degrees F ). Keep bottle closed tightly to protect this medicine from light and moisture. Throw away any unused medicine after the expiration date. NOTE: This sheet is a summary. It may not cover all possible information. If you have questions about this medicine, talk to your doctor, pharmacist, or health care provider.  2020 Elsevier/Gold Standard (2018-12-16 16:43:28) Furosemide Oral Tablets What is this medicine? FUROSEMIDE (fyoor [place] se mide) is a diuretic. It helps you make more urine and to lose salt and excess water from your body. It treats swelling from heart, kidney, or liver disease. It also treats high blood pressure. This medicine may be used for other purposes; ask your health care provider or pharmacist if you have questions. COMMON BRAND NAME(S): Active-Medicated Specimen Kit, Delone, Diuscreen, Lasix, RX Specimen Collection Kit,  Specimen Collection Kit, Tennyson Medicated Specimen Collection What should I tell my health care provider before I take this medicine? They need to know  if you have any of these conditions:  abnormal blood electrolytes  diarrhea or vomiting  gout  heart disease  kidney disease, small amounts of urine, or difficulty passing urine  liver disease  thyroid disease  an unusual or allergic reaction to furosemide, sulfa drugs, other medicines, foods, dyes, or preservatives  pregnant or trying to get pregnant  breast-feeding How should I use this medicine? Take this drug by mouth. Take it as directed on the prescription label at the same time every day. You can take it with or without food. If it upsets your stomach, take it with food. Keep taking it unless your health care provider tells you to stop. Talk to your health care provider about the use of this drug in children. Special care may be needed. Overdosage: If you think you have taken too much of this medicine contact a poison control center or emergency room at once. NOTE: This medicine is only for you. Do not share this medicine with others. What if I miss a dose? If you miss a dose, take it as soon as you can. If it is almost time for your next dose, take only that dose. Do not take double or extra doses. What may interact with this medicine?  aspirin and aspirin-like medicines  certain antibiotics  chloral hydrate  cisplatin  cyclosporine  digoxin  diuretics  laxatives  lithium  medicines for blood pressure  medicines that relax muscles for surgery  methotrexate  NSAIDs, medicines for pain and inflammation like ibuprofen, naproxen, or indomethacin  phenytoin  steroid medicines like prednisone or cortisone  sucralfate  thyroid hormones This list may not describe all possible interactions. Give your health care provider a list of all the medicines, herbs, non-prescription drugs, or dietary supplements you use. Also tell them if you smoke, drink alcohol, or use illegal drugs. Some items may interact with your medicine. What should I watch for while using  this medicine? Visit your doctor or health care provider for regular checks on your progress. Check your blood pressure regularly. Ask your doctor or health care provider what your blood pressure should be, and when you should contact him or her. If you are a diabetic, check your blood sugar as directed. This medicine may cause serious skin reactions. They can happen weeks to months after starting the medicine. Contact your health care provider right away if you notice fevers or flu-like symptoms with a rash. The rash may be red or purple and then turn into blisters or peeling of the skin. Or, you might notice a red rash with swelling of the face, lips or lymph nodes in your neck or under your arms. You may need to be on a special diet while taking this medicine. Check with your doctor. Also, ask how many glasses of fluid you need to drink a day. You must not get dehydrated. You may get drowsy or dizzy. Do not drive, use machinery, or do anything that needs mental alertness until you know how this drug affects you. Do not stand or sit up quickly, especially if you are an older patient. This reduces the risk of dizzy or fainting spells. Alcohol can make you more drowsy and dizzy. Avoid alcoholic drinks. This medicine can make you more sensitive to the sun. Keep out of the sun. If you cannot avoid being in the  sun, wear protective clothing and use sunscreen. Do not use sun lamps or tanning beds/booths. What side effects may I notice from receiving this medicine? Side effects that you should report to your doctor or health care professional as soon as possible:  blood in urine or stools  dry mouth  fever or chills  hearing loss or ringing in the ears  irregular heartbeat  muscle pain or weakness, cramps  rash, fever, and swollen lymph nodes  redness, blistering, peeling or loosening of the skin, including inside the mouth  skin rash  stomach upset, pain, or nausea  tingling or numbness in  the hands or feet  unusually weak or tired  vomiting or diarrhea  yellowing of the eyes or skin Side effects that usually do not require medical attention (report to your doctor or health care professional if they continue or are bothersome):  headache  loss of appetite  unusual bleeding or bruising This list may not describe all possible side effects. Call your doctor for medical advice about side effects. You may report side effects to FDA at 1-800-FDA-1088. Where should I keep my medicine? Keep out of the reach of children and pets. Store at room temperature between 20 and 25 degrees C (68 and 77 degrees F). Protect from light and moisture. Keep the container tightly closed. Throw away any unused drug after the expiration date. NOTE: This sheet is a summary. It may not cover all possible information. If you have questions about this medicine, talk to your doctor, pharmacist, or health care provider.  2020 Elsevier/Gold Standard (2018-10-07 18:01:32)      Adopting a Healthy Lifestyle.  Know what a healthy weight is for you (roughly BMI <25) and aim to maintain this   Aim for 7+ servings of fruits and vegetables daily   65-80+ fluid ounces of water or unsweet tea for healthy kidneys   Limit to max 1 drink of alcohol per day; avoid smoking/tobacco   Limit animal fats in diet for cholesterol and heart health - choose grass fed whenever available   Avoid highly processed foods, and foods high in saturated/trans fats   Aim for low stress - take time to unwind and care for your mental health   Aim for 150 min of moderate intensity exercise weekly for heart health, and weights twice weekly for bone health   Aim for 7-9 hours of sleep daily   When it comes to diets, agreement about the perfect plan isnt easy to find, even among the experts. Experts at the [employer] developed an idea known as the Healthy Eating Plate. Just imagine a plate divided into  logical, healthy portions.   The emphasis is on diet quality:   Load up on vegetables and fruits - one-half of your plate: Aim for color and variety, and remember that potatoes dont count.   Go for whole grains - one-quarter of your plate: Whole wheat, barley, wheat berries, quinoa, oats, brown rice, and foods made with them. If you want pasta, go with whole wheat pasta.   Protein power - one-quarter of your plate: Fish, chicken, beans, and nuts are all healthy, versatile protein sources. Limit red meat.   The diet, however, does go beyond the plate, offering a few other suggestions.   Use healthy plant oils, such as olive, canola, soy, corn, sunflower and peanut. Check the labels, and avoid partially hydrogenated oil, which have unhealthy trans fats.   If youre thirsty, drink water. Coffee and  tea are good in moderation, but skip sugary drinks and limit milk and dairy products to one or two daily servings.   The type of carbohydrate in the diet is more important than the amount. Some sources of carbohydrates, such as vegetables, fruits, whole grains, and beans-are healthier than others.   Finally, stay active  Signed, Melinda Salines, DO  09/24/2019 11:17 AM    [place]

## 2019-09-25 ENCOUNTER — Telehealth: Payer: Self-pay

## 2019-09-25 DIAGNOSIS — H40013 Open angle with borderline findings, low risk, bilateral: Secondary | ICD-10-CM | POA: Diagnosis not present

## 2019-09-25 LAB — LIPID PANEL
Chol/HDL Ratio: 5.3 ratio — ABNORMAL HIGH (ref 0.0–4.4)
Cholesterol, Total: 186 mg/dL (ref 100–199)
HDL: 35 mg/dL — ABNORMAL LOW (ref >39)
LDL Chol Calc (NIH): 110 mg/dL — ABNORMAL HIGH (ref 0–99)
Triglycerides: 237 mg/dL — ABNORMAL HIGH (ref 0–149)
VLDL Cholesterol Cal: 41 mg/dL — ABNORMAL HIGH (ref 5–40)

## 2019-09-25 LAB — BASIC METABOLIC PANEL
BUN/Creatinine Ratio: 23 (ref 12–28)
BUN: 33 mg/dL — ABNORMAL HIGH (ref 8–27)
CO2: 24 mmol/L (ref 20–29)
Calcium: 9.5 mg/dL (ref 8.7–10.3)
Chloride: 103 mmol/L (ref 96–106)
Creatinine, Ser: 1.46 mg/dL — ABNORMAL HIGH (ref 0.57–1.00)
GFR calc Af Amer: 38 mL/min/{1.73_m2} — ABNORMAL LOW (ref >59)
GFR calc non Af Amer: 33 mL/min/{1.73_m2} — ABNORMAL LOW (ref >59)
Glucose: 86 mg/dL (ref 65–99)
Potassium: 4.7 mmol/L (ref 3.5–5.2)
Sodium: 141 mmol/L (ref 134–144)

## 2019-09-25 LAB — MAGNESIUM: Magnesium: 2 mg/dL (ref 1.6–2.3)

## 2019-09-25 LAB — CBC WITH DIFFERENTIAL/PLATELET
Basophils Absolute: 0 10*3/uL (ref 0.0–0.2)
Basos: 1 %
EOS (ABSOLUTE): 0.1 10*3/uL (ref 0.0–0.4)
Eos: 1 %
Hematocrit: 38.9 % (ref 34.0–46.6)
Hemoglobin: 12.8 g/dL (ref 11.1–15.9)
Immature Grans (Abs): 0 10*3/uL (ref 0.0–0.1)
Immature Granulocytes: 1 %
Lymphocytes Absolute: 1.9 10*3/uL (ref 0.7–3.1)
Lymphs: 31 %
MCH: 33.9 pg — ABNORMAL HIGH (ref 26.6–33.0)
MCHC: 32.9 g/dL (ref 31.5–35.7)
MCV: 103 fL — ABNORMAL HIGH (ref 79–97)
Monocytes Absolute: 0.7 10*3/uL (ref 0.1–0.9)
Monocytes: 11 %
Neutrophils Absolute: 3.3 10*3/uL (ref 1.4–7.0)
Neutrophils: 55 %
Platelets: 240 10*3/uL (ref 150–450)
RBC: 3.78 x10E6/uL (ref 3.77–5.28)
RDW: 12.5 % (ref 11.7–15.4)
WBC: 6 10*3/uL (ref 3.4–10.8)

## 2019-09-25 NOTE — Telephone Encounter (Signed)
-----   Message from Berniece Salines, DO sent at 09/25/2019  8:52 AM EDT ----- Knee function slightly worsening.  Your cholesterol profile has gotten worse it HDL is worse than 35 was 41 LDL is 110, triglycerides 237 was 176.  We can try low-dose statin medications if you are ready to try them.

## 2019-09-25 NOTE — Telephone Encounter (Signed)
Tried calling patient. No answer and no voicemail set up for me to leave a message. 

## 2019-09-28 ENCOUNTER — Telehealth: Payer: Self-pay

## 2019-09-28 NOTE — Telephone Encounter (Signed)
-----   Message from Berniece Salines, DO sent at 09/25/2019  8:52 AM EDT ----- Knee function slightly worsening.  Your cholesterol profile has gotten worse it HDL is worse than 35 was 41 LDL is 110, triglycerides 237 was 176.  We can try low-dose statin medications if you are ready to try them.

## 2019-09-28 NOTE — Telephone Encounter (Signed)
Tried calling patient. No answer and no voicemail set up for me to leave a message. 

## 2019-09-29 ENCOUNTER — Telehealth: Payer: Self-pay

## 2019-09-29 NOTE — Telephone Encounter (Signed)
-----   Message from Berniece Salines, DO sent at 09/25/2019  8:52 AM EDT ----- Knee function slightly worsening.  Your cholesterol profile has gotten worse it HDL is worse than 35 was 41 LDL is 110, triglycerides 237 was 176.  We can try low-dose statin medications if you are ready to try them.

## 2019-09-29 NOTE — Telephone Encounter (Signed)
Tried calling patient. No answer and no voicemail set up for me to leave a message. 

## 2019-09-30 ENCOUNTER — Telehealth: Payer: Self-pay

## 2019-09-30 NOTE — Telephone Encounter (Signed)
-----   Message from Berniece Salines, DO sent at 09/25/2019  8:52 AM EDT ----- Knee function slightly worsening.  Your cholesterol profile has gotten worse it HDL is worse than 35 was 41 LDL is 110, triglycerides 237 was 176.  We can try low-dose statin medications if you are ready to try them.

## 2019-09-30 NOTE — Telephone Encounter (Signed)
Tried calling patient. No answer and no voicemail set up for me to leave a message. 

## 2019-10-02 ENCOUNTER — Telehealth: Payer: Self-pay

## 2019-10-02 MED ORDER — PRAVASTATIN SODIUM 20 MG PO TABS
20.0000 mg | ORAL_TABLET | Freq: Every evening | ORAL | 3 refills | Status: DC
Start: 2019-10-02 — End: 2020-10-03

## 2019-10-02 NOTE — Telephone Encounter (Signed)
Spoke with patient regarding results and recommendation.  Patient verbalizes understanding and is agreeable to plan of care. Advised patient to call back with any issues or concerns.  

## 2019-10-02 NOTE — Addendum Note (Signed)
Addended by: Resa Miner I on: 10/02/2019 09:10 AM   Modules accepted: Orders

## 2019-10-02 NOTE — Telephone Encounter (Signed)
Start pravastatin 20 mg daily  

## 2019-10-02 NOTE — Telephone Encounter (Signed)
-----   Message from Berniece Salines, DO sent at 09/25/2019  8:52 AM EDT ----- Knee function slightly worsening.  Your cholesterol profile has gotten worse it HDL is worse than 35 was 41 LDL is 110, triglycerides 237 was 176.  We can try low-dose statin medications if you are ready to try them.

## 2019-10-23 DIAGNOSIS — H40013 Open angle with borderline findings, low risk, bilateral: Secondary | ICD-10-CM | POA: Diagnosis not present

## 2019-11-23 DIAGNOSIS — I519 Heart disease, unspecified: Secondary | ICD-10-CM | POA: Insufficient documentation

## 2019-11-23 DIAGNOSIS — N289 Disorder of kidney and ureter, unspecified: Secondary | ICD-10-CM | POA: Insufficient documentation

## 2019-11-23 DIAGNOSIS — M810 Age-related osteoporosis without current pathological fracture: Secondary | ICD-10-CM | POA: Insufficient documentation

## 2019-11-23 DIAGNOSIS — I10 Essential (primary) hypertension: Secondary | ICD-10-CM | POA: Insufficient documentation

## 2020-03-03 DIAGNOSIS — M25511 Pain in right shoulder: Secondary | ICD-10-CM | POA: Diagnosis not present

## 2020-03-03 DIAGNOSIS — S42021B Displaced fracture of shaft of right clavicle, initial encounter for open fracture: Secondary | ICD-10-CM | POA: Diagnosis not present

## 2020-03-03 DIAGNOSIS — R0789 Other chest pain: Secondary | ICD-10-CM | POA: Diagnosis not present

## 2020-03-03 DIAGNOSIS — M898X1 Other specified disorders of bone, shoulder: Secondary | ICD-10-CM | POA: Diagnosis not present

## 2020-03-03 DIAGNOSIS — S42021A Displaced fracture of shaft of right clavicle, initial encounter for closed fracture: Secondary | ICD-10-CM | POA: Diagnosis not present

## 2020-03-03 DIAGNOSIS — M85811 Other specified disorders of bone density and structure, right shoulder: Secondary | ICD-10-CM | POA: Diagnosis not present

## 2020-03-03 DIAGNOSIS — K449 Diaphragmatic hernia without obstruction or gangrene: Secondary | ICD-10-CM | POA: Diagnosis not present

## 2020-03-09 DIAGNOSIS — N183 Chronic kidney disease, stage 3 unspecified: Secondary | ICD-10-CM | POA: Diagnosis not present

## 2020-03-09 DIAGNOSIS — S20211D Contusion of right front wall of thorax, subsequent encounter: Secondary | ICD-10-CM | POA: Diagnosis not present

## 2020-03-09 DIAGNOSIS — S42001A Fracture of unspecified part of right clavicle, initial encounter for closed fracture: Secondary | ICD-10-CM | POA: Diagnosis not present

## 2020-03-09 DIAGNOSIS — I10 Essential (primary) hypertension: Secondary | ICD-10-CM | POA: Diagnosis not present

## 2020-03-09 DIAGNOSIS — S46811S Strain of other muscles, fascia and tendons at shoulder and upper arm level, right arm, sequela: Secondary | ICD-10-CM | POA: Diagnosis not present

## 2020-03-09 DIAGNOSIS — W19XXXD Unspecified fall, subsequent encounter: Secondary | ICD-10-CM | POA: Diagnosis not present

## 2020-03-10 DIAGNOSIS — M898X1 Other specified disorders of bone, shoulder: Secondary | ICD-10-CM | POA: Insufficient documentation

## 2020-03-11 DIAGNOSIS — M25511 Pain in right shoulder: Secondary | ICD-10-CM | POA: Diagnosis not present

## 2020-03-11 DIAGNOSIS — S42021A Displaced fracture of shaft of right clavicle, initial encounter for closed fracture: Secondary | ICD-10-CM | POA: Diagnosis not present

## 2020-03-18 ENCOUNTER — Other Ambulatory Visit: Payer: Self-pay | Admitting: Cardiology

## 2020-04-05 DIAGNOSIS — M5135 Other intervertebral disc degeneration, thoracolumbar region: Secondary | ICD-10-CM | POA: Diagnosis not present

## 2020-04-05 DIAGNOSIS — M545 Low back pain, unspecified: Secondary | ICD-10-CM | POA: Insufficient documentation

## 2020-04-05 DIAGNOSIS — M25511 Pain in right shoulder: Secondary | ICD-10-CM | POA: Diagnosis not present

## 2020-04-18 DIAGNOSIS — Z79899 Other long term (current) drug therapy: Secondary | ICD-10-CM | POA: Diagnosis not present

## 2020-04-18 DIAGNOSIS — N2581 Secondary hyperparathyroidism of renal origin: Secondary | ICD-10-CM | POA: Diagnosis not present

## 2020-04-18 DIAGNOSIS — Z0000 Encounter for general adult medical examination without abnormal findings: Secondary | ICD-10-CM | POA: Diagnosis not present

## 2020-04-18 DIAGNOSIS — Z9181 History of falling: Secondary | ICD-10-CM | POA: Diagnosis not present

## 2020-04-18 DIAGNOSIS — Z131 Encounter for screening for diabetes mellitus: Secondary | ICD-10-CM | POA: Diagnosis not present

## 2020-04-18 DIAGNOSIS — Z13220 Encounter for screening for lipoid disorders: Secondary | ICD-10-CM | POA: Diagnosis not present

## 2020-04-20 DIAGNOSIS — N189 Chronic kidney disease, unspecified: Secondary | ICD-10-CM | POA: Diagnosis not present

## 2020-05-16 DIAGNOSIS — M5459 Other low back pain: Secondary | ICD-10-CM | POA: Diagnosis not present

## 2020-05-23 DIAGNOSIS — M5459 Other low back pain: Secondary | ICD-10-CM | POA: Diagnosis not present

## 2020-05-24 DIAGNOSIS — H40013 Open angle with borderline findings, low risk, bilateral: Secondary | ICD-10-CM | POA: Diagnosis not present

## 2020-05-25 DIAGNOSIS — N179 Acute kidney failure, unspecified: Secondary | ICD-10-CM | POA: Diagnosis not present

## 2020-05-25 DIAGNOSIS — R809 Proteinuria, unspecified: Secondary | ICD-10-CM | POA: Diagnosis not present

## 2020-05-25 DIAGNOSIS — K219 Gastro-esophageal reflux disease without esophagitis: Secondary | ICD-10-CM | POA: Diagnosis not present

## 2020-05-25 DIAGNOSIS — I509 Heart failure, unspecified: Secondary | ICD-10-CM | POA: Diagnosis not present

## 2020-05-25 DIAGNOSIS — I10 Essential (primary) hypertension: Secondary | ICD-10-CM | POA: Diagnosis not present

## 2020-05-25 DIAGNOSIS — R309 Painful micturition, unspecified: Secondary | ICD-10-CM | POA: Diagnosis not present

## 2020-05-25 DIAGNOSIS — N183 Chronic kidney disease, stage 3 unspecified: Secondary | ICD-10-CM | POA: Diagnosis not present

## 2020-05-26 DIAGNOSIS — N179 Acute kidney failure, unspecified: Secondary | ICD-10-CM | POA: Insufficient documentation

## 2020-05-26 DIAGNOSIS — I509 Heart failure, unspecified: Secondary | ICD-10-CM | POA: Insufficient documentation

## 2020-05-26 DIAGNOSIS — N183 Chronic kidney disease, stage 3 unspecified: Secondary | ICD-10-CM | POA: Insufficient documentation

## 2020-06-03 DIAGNOSIS — M545 Low back pain, unspecified: Secondary | ICD-10-CM | POA: Diagnosis not present

## 2020-06-08 DIAGNOSIS — M545 Low back pain, unspecified: Secondary | ICD-10-CM | POA: Diagnosis not present

## 2020-09-06 DIAGNOSIS — M48061 Spinal stenosis, lumbar region without neurogenic claudication: Secondary | ICD-10-CM | POA: Diagnosis not present

## 2020-09-06 DIAGNOSIS — M419 Scoliosis, unspecified: Secondary | ICD-10-CM | POA: Insufficient documentation

## 2020-09-06 DIAGNOSIS — M4184 Other forms of scoliosis, thoracic region: Secondary | ICD-10-CM | POA: Diagnosis not present

## 2020-09-22 DIAGNOSIS — D509 Iron deficiency anemia, unspecified: Secondary | ICD-10-CM | POA: Diagnosis not present

## 2020-09-22 DIAGNOSIS — N2581 Secondary hyperparathyroidism of renal origin: Secondary | ICD-10-CM | POA: Diagnosis not present

## 2020-09-22 DIAGNOSIS — I130 Hypertensive heart and chronic kidney disease with heart failure and stage 1 through stage 4 chronic kidney disease, or unspecified chronic kidney disease: Secondary | ICD-10-CM | POA: Diagnosis not present

## 2020-09-22 DIAGNOSIS — G250 Essential tremor: Secondary | ICD-10-CM | POA: Diagnosis not present

## 2020-09-22 DIAGNOSIS — L505 Cholinergic urticaria: Secondary | ICD-10-CM | POA: Diagnosis not present

## 2020-09-22 DIAGNOSIS — N183 Chronic kidney disease, stage 3 unspecified: Secondary | ICD-10-CM | POA: Diagnosis not present

## 2020-09-22 DIAGNOSIS — I700 Atherosclerosis of aorta: Secondary | ICD-10-CM | POA: Diagnosis not present

## 2020-10-01 ENCOUNTER — Other Ambulatory Visit: Payer: Self-pay | Admitting: Cardiology

## 2020-10-03 ENCOUNTER — Other Ambulatory Visit: Payer: Self-pay | Admitting: Cardiology

## 2020-10-03 NOTE — Telephone Encounter (Signed)
Rx sent with instructions to arrange appt with Dr. Harriet Masson for additional refills. 1rst Attempt

## 2020-10-05 ENCOUNTER — Telehealth: Payer: Self-pay | Admitting: Cardiology

## 2020-10-05 NOTE — Telephone Encounter (Signed)
Pt c/o Shortness Of Breath: STAT if SOB developed within the last 24 hours or pt is noticeably SOB on the phone  1. Are you currently SOB (can you hear that pt is SOB on the phone)? No   2. How long have you been experiencing SOB?  A week  3. Are you SOB when sitting or when up moving around? Moving around  4. Are you currently experiencing any other symptoms? No

## 2020-10-05 NOTE — Telephone Encounter (Signed)
Spoke with patient. She states "this shortness of breath is a chronic thing, I'm not in crisis or distress or anything." Patient was due for a 6 month follow up. Added to Dr. Terrial Rhodes schedule for 8/15. Patient wants to see Dr. Bettina Gavia after Dr. Harriet Masson transfers.

## 2020-10-10 DIAGNOSIS — B1910 Unspecified viral hepatitis B without hepatic coma: Secondary | ICD-10-CM | POA: Insufficient documentation

## 2020-10-10 DIAGNOSIS — R310 Gross hematuria: Secondary | ICD-10-CM | POA: Insufficient documentation

## 2020-10-10 DIAGNOSIS — K219 Gastro-esophageal reflux disease without esophagitis: Secondary | ICD-10-CM | POA: Insufficient documentation

## 2020-10-10 DIAGNOSIS — H409 Unspecified glaucoma: Secondary | ICD-10-CM | POA: Insufficient documentation

## 2020-10-10 DIAGNOSIS — R300 Dysuria: Secondary | ICD-10-CM | POA: Insufficient documentation

## 2020-10-10 DIAGNOSIS — E785 Hyperlipidemia, unspecified: Secondary | ICD-10-CM | POA: Insufficient documentation

## 2020-10-10 DIAGNOSIS — N816 Rectocele: Secondary | ICD-10-CM | POA: Insufficient documentation

## 2020-10-10 DIAGNOSIS — D649 Anemia, unspecified: Secondary | ICD-10-CM | POA: Insufficient documentation

## 2020-10-10 DIAGNOSIS — M8580 Other specified disorders of bone density and structure, unspecified site: Secondary | ICD-10-CM | POA: Insufficient documentation

## 2020-10-10 DIAGNOSIS — K5731 Diverticulosis of large intestine without perforation or abscess with bleeding: Secondary | ICD-10-CM | POA: Insufficient documentation

## 2020-10-10 DIAGNOSIS — Z8249 Family history of ischemic heart disease and other diseases of the circulatory system: Secondary | ICD-10-CM | POA: Insufficient documentation

## 2020-10-10 DIAGNOSIS — N8110 Cystocele, unspecified: Secondary | ICD-10-CM | POA: Insufficient documentation

## 2020-10-10 DIAGNOSIS — R0989 Other specified symptoms and signs involving the circulatory and respiratory systems: Secondary | ICD-10-CM | POA: Insufficient documentation

## 2020-10-10 DIAGNOSIS — Z860100 Personal history of colon polyps, unspecified: Secondary | ICD-10-CM | POA: Insufficient documentation

## 2020-10-17 ENCOUNTER — Other Ambulatory Visit: Payer: Self-pay

## 2020-10-17 ENCOUNTER — Ambulatory Visit: Payer: Medicare Other | Admitting: Cardiology

## 2020-10-17 ENCOUNTER — Encounter: Payer: Self-pay | Admitting: Cardiology

## 2020-10-17 VITALS — BP 130/76 | HR 78 | Ht 62.0 in | Wt 161.0 lb

## 2020-10-17 DIAGNOSIS — R0602 Shortness of breath: Secondary | ICD-10-CM

## 2020-10-17 DIAGNOSIS — I10 Essential (primary) hypertension: Secondary | ICD-10-CM

## 2020-10-17 DIAGNOSIS — N183 Chronic kidney disease, stage 3 unspecified: Secondary | ICD-10-CM | POA: Diagnosis not present

## 2020-10-17 DIAGNOSIS — E782 Mixed hyperlipidemia: Secondary | ICD-10-CM

## 2020-10-17 NOTE — Progress Notes (Signed)
Cardiology Office Note:    Date:  10/17/2020   ID:  Melinda Briggs, DOB [birth year], MRN [id number]  PCP:  Serita Grammes, MD  Cardiologist:  Berniece Salines, DO  Electrophysiologist:  None   Referring MD: Serita Grammes, MD   " I have been having some shortness of breath"   History of Present Illness:    Melinda Briggs is a 85 y.o. female with a hx of   hypertension, hyperlipidemia presents for the following up visit. I last saw the patient on 12/30/2018 at which time she appears to be doing well from a cardiovascular standpoint.     I initially did see the patient on September 20 at which time her blood pressure was elevated.  At that she expressed that she did have whitecoat hypertension and her blood pressure usually is better at home.  Therefore no medication changes were made.  She follow-up a week later for the blood pressures between 130s and 140s at home.  At the time she preferred not to be added on any new antihypertensive.  Therefore she was continued on her metoprolol succinate 25 mg daily, her lisinopril milligrams daily, hydrochlorothiazide 25 g daily and Lasix 20 mg daily.  I saw the patient September 24, 2019 at that time she was status post endoscopy/colonoscopy  which showed a 3 cm submucosal lesion concerning for lipoma versus leiomyoma.  She also did have a hiatal hernia with tortuous esophagus.  During our visit we increased her Lasix.  She is here today for follow-up visit.  She tells me that she has been experiencing worsening shortness of breath.  No chest pain.    Past Medical History:  Diagnosis Date   Anemia    Carotid bruit    Cystocele with rectocele    Diverticulosis of colon with hemorrhage    Dysuria    Family history of ischemic heart disease (IHD)    GERD (gastroesophageal reflux disease)    Glaucoma    Hematuria, gross    Hepatitis B    History of colon polyps    Hyperlipidemia    Hypertension    Non-cardiac chest pain    Osteopenia     Rectocele     Past Surgical History:  Procedure Laterality Date   BREAST REDUCTION SURGERY  1998   CARDIAC CATHETERIZATION     COLONOSCOPY  02/11/2012   Moderate to severe sigmoid diverticulosis, small internal hemorrhoids.   KNEE ARTHROSCOPY  2004   KNEE ARTHROSCOPY  2005   KNEE SURGERY     x3   LEFT HEART CATH Left 10/13/2009   UPPER GASTROINTESTINAL ENDOSCOPY  06/18/2012   Presbyesophagus, large hiatal hernia, healed cameron erosion, mild gastritis.    Current Medications: Current Meds  Medication Sig   acetaminophen (TYLENOL) 500 MG tablet Take 500 mg by mouth every 6 (six) hours as needed.    bimatoprost (LUMIGAN) 0.03 % ophthalmic solution Place 1 drop into both eyes at bedtime.   esomeprazole (NEXIUM) 20 MG capsule Take 1 capsule (20 mg total) by mouth daily before breakfast.   furosemide (LASIX) 40 MG tablet TAKE 1 TABLET(40 MG) BY MOUTH IN THE MORNING   isosorbide mononitrate (IMDUR) 30 MG 24 hr tablet TAKE 1 TABLET(30 MG) BY MOUTH DAILY   lisinopril-hydrochlorothiazide (ZESTORETIC) 20-25 MG tablet Take 1 tablet by mouth daily.   LUMIGAN 0.01 % SOLN 1 drop at bedtime.   metoprolol succinate (TOPROL-XL) 25 MG 24 hr tablet Take 1 tablet (25 mg total) by mouth daily.  potassium chloride SA (KLOR-CON) 20 MEQ tablet Take 1 tablet (20 mEq total) by mouth daily.   pravastatin (PRAVACHOL) 20 MG tablet TAKE 1 TABLET(20 MG) BY MOUTH EVERY EVENING     Allergies:   Contrast media [iodinated diagnostic agents], Gadolinium derivatives, and Green dye   Social History   Socioeconomic History   Marital status: Married    Spouse name: Not on file   Number of children: Not on file   Years of education: Not on file   Highest education level: Not on file  Occupational History   Not on file  Tobacco Use   Smoking status: Never   Smokeless tobacco: Never  Vaping Use   Vaping Use: Never used  Substance and Sexual Activity   Alcohol use: Not Currently   Drug use: Never   Sexual  activity: Not on file  Other Topics Concern   Not on file  Social History Narrative   Not on file   Social Determinants of Health   Financial Resource Strain: Not on file  Food Insecurity: Not on file  Transportation Needs: Not on file  Physical Activity: Not on file  Stress: Not on file  Social Connections: Not on file     Family History: The patient's family history includes Breast cancer in her maternal grandmother; Colon cancer in her maternal grandmother; Heart attack in her father and mother.  ROS:   Review of Systems  Constitution: Negative for decreased appetite, fever and weight gain.  HENT: Negative for congestion, ear discharge, hoarse voice and sore throat.   Eyes: Negative for discharge, redness, vision loss in right eye and visual halos.  Cardiovascular: Report dyspnea on exertion.  Negative for chest pain,leg swelling, orthopnea and palpitations.  Respiratory: Negative for cough, hemoptysis, shortness of breath and snoring.   Endocrine: Negative for heat intolerance and polyphagia.  Hematologic/Lymphatic: Negative for bleeding problem. Does not bruise/bleed easily.  Skin: Negative for flushing, nail changes, rash and suspicious lesions.  Musculoskeletal: Negative for arthritis, joint pain, muscle cramps, myalgias, neck pain and stiffness.  Gastrointestinal: Negative for abdominal pain, bowel incontinence, diarrhea and excessive appetite.  Genitourinary: Negative for decreased libido, genital sores and incomplete emptying.  Neurological: Negative for brief paralysis, focal weakness, headaches and loss of balance.  Psychiatric/Behavioral: Negative for altered mental status, depression and suicidal ideas.  Allergic/Immunologic: Negative for HIV exposure and persistent infections.    EKGs/Labs/Other Studies Reviewed:    The following studies were reviewed today:   EKG:  The ekg ordered today demonstrates sinus rhythm, heart rate 70 bpm with rare  PACs  Transthoracic echocardiogramIMPRESSIONS 12/23/2018   1. Left ventricular ejection fraction, by visual estimation, is 60 to 65%. The left ventricle has normal function. Normal left ventricular size. There is mildly increased left ventricular hypertrophy.  2. Left ventricular diastolic Doppler parameters are consistent with pseudonormalization pattern of LV diastolic filling.  3. Global right ventricle has normal systolic function.The right ventricular size is normal. No increase in right ventricular wall thickness.  4. Left atrial size was mildly dilated.  5. Right atrial size was normal.  6. The mitral valve is normal in structure. Mild mitral valve regurgitation. No evidence of mitral stenosis.  7. The tricuspid valve is normal in structure. Tricuspid valve regurgitation is trivial.  8. The aortic valve is normal in structure. Aortic valve regurgitation is mild by color flow Doppler. Structurally normal aortic valve, with no evidence of sclerosis or stenosis.  9. The pulmonic valve was normal in structure. Pulmonic  valve regurgitation is not visualized by color flow Doppler. 10. Normal pulmonary artery systolic pressure. 11. The inferior vena cava is normal in size with greater than 50% respiratory variability, suggesting right atrial pressure of 3 mmHg.     Pharmacologic stress test The left ventricular ejection fraction is hyperdynamic (>65%). Nuclear stress EF: 70%. There was no ST segment deviation noted during stress. No T wave inversion was noted during stress. Defect 1: There is a small defect of mild severity present in the apical anterior and apical septal location. This defect is nonreversible with normal wall motion. The study is normal. The fixed defect described above is likely due to soft tissue attenuation. This is a low risk study.  Recent Labs: No results found for requested labs within last 8760 hours.  Recent Lipid Panel    Component Value Date/Time   CHOL 186  09/24/2019 1130   TRIG 237 (H) 09/24/2019 1130   HDL 35 (L) 09/24/2019 1130   CHOLHDL 5.3 (H) 09/24/2019 1130   LDLCALC 110 (H) 09/24/2019 1130    Physical Exam:    VS:  BP 130/76 (BP Location: Right Arm, Patient Position: Sitting, Cuff Size: Normal)   Pulse 78   Ht '5\' 2"'$  (1.575 m)   Wt 161 lb (73 kg)   SpO2 98%   BMI 29.45 kg/m     Wt Readings from Last 3 Encounters:  10/17/20 161 lb (73 kg)  09/24/19 175 lb 6.4 oz (79.6 kg)  01/14/19 174 lb (78.9 kg)     GEN: Well nourished, well developed in no acute distress HEENT: Normal NECK: No JVD; No carotid bruits LYMPHATICS: No lymphadenopathy CARDIAC: S1S2 noted,RRR, no murmurs, rubs, gallops RESPIRATORY:  Clear to auscultation without rales, wheezing or rhonchi  ABDOMEN: Soft, non-tender, non-distended, +bowel sounds, no guarding. EXTREMITIES: No edema, No cyanosis, no clubbing MUSCULOSKELETAL:  No deformity  SKIN: Warm and dry NEUROLOGIC:  Alert and oriented x 3, non-focal PSYCHIATRIC:  Normal affect, good insight  ASSESSMENT:    1. SOB (shortness of breath)   2. Essential hypertension   3. Mixed hyperlipidemia   4. Stage 3 chronic kidney disease, unspecified whether stage 3a or 3b CKD ([place])    PLAN:     She is experiencing shortness of breath going which is worsening on exertion.  We will place an order in for repeat echocardiogram to reassess LV function and for any valvular abnormalities.  She is on Lasix which no changes will be made to that regimen for now.  I reviewed her blood work which was done by her PCP.  She will her most recent creatinine is 1.8.  Blood pressure is acceptable, continue with current antihypertensive regimen.  Hyperlipidemia-continue statins  The patient is in agreement with the above plan. The patient left the office in stable condition.  The patient will follow up in 1 year.   Medication Adjustments/Labs and Tests Ordered: Current medicines are reviewed at length with the patient  today.  Concerns regarding medicines are outlined above.  Orders Placed This Encounter  Procedures   EKG 12-Lead   ECHOCARDIOGRAM COMPLETE   No orders of the defined types were placed in this encounter.   Patient Instructions  Medication Instructions:  Your physician recommends that you continue on your current medications as directed. Please refer to the Current Medication list given to you today.   *If you need a refill on your cardiac medications before your next appointment, please call your pharmacy*   Lab Work: None If  you have labs (blood work) drawn today and your tests are completely normal, you will receive your results only by: [place] (if you have MyChart) OR A paper copy in the mail If you have any lab test that is abnormal or we need to change your treatment, we will call you to review the results.   Testing/Procedures: Your physician has requested that you have an echocardiogram. Echocardiography is a painless test that uses sound waves to create images of your heart. It provides your doctor with information about the size and shape of your heart and how well your heart's chambers and valves are working. This procedure takes approximately one hour. There are no restrictions for this procedure.    Follow-Up: At [facility], you and your health needs are our priority.  As part of our continuing mission to provide you with exceptional heart care, we have created designated Provider Care Teams.  These Care Teams include your primary Cardiologist (physician) and Advanced Practice Providers (APPs -  Physician Assistants and Nurse Practitioners) who all work together to provide you with the care you need, when you need it.  We recommend signing up for the patient portal called "MyChart".  Sign up information is provided on this After Visit Summary.  MyChart is used to connect with patients for Virtual Visits (Telemedicine).  Patients are able to view lab/test  results, encounter notes, upcoming appointments, etc.  Non-urgent messages can be sent to your provider as well.   To learn more about what you can do with MyChart, go to [email or web address].    Your next appointment:   1 year(s)  The format for your next appointment:   In Person   Other Instructions    Adopting a Healthy Lifestyle.  Know what a healthy weight is for you (roughly BMI <25) and aim to maintain this   Aim for 7+ servings of fruits and vegetables daily   65-80+ fluid ounces of water or unsweet tea for healthy kidneys   Limit to max 1 drink of alcohol per day; avoid smoking/tobacco   Limit animal fats in diet for cholesterol and heart health - choose grass fed whenever available   Avoid highly processed foods, and foods high in saturated/trans fats   Aim for low stress - take time to unwind and care for your mental health   Aim for 150 min of moderate intensity exercise weekly for heart health, and weights twice weekly for bone health   Aim for 7-9 hours of sleep daily   When it comes to diets, agreement about the perfect plan isnt easy to find, even among the experts. Experts at the [employer] developed an idea known as the Healthy Eating Plate. Just imagine a plate divided into logical, healthy portions.   The emphasis is on diet quality:   Load up on vegetables and fruits - one-half of your plate: Aim for color and variety, and remember that potatoes dont count.   Go for whole grains - one-quarter of your plate: Whole wheat, barley, wheat berries, quinoa, oats, brown rice, and foods made with them. If you want pasta, go with whole wheat pasta.   Protein power - one-quarter of your plate: Fish, chicken, beans, and nuts are all healthy, versatile protein sources. Limit red meat.   The diet, however, does go beyond the plate, offering a few other suggestions.   Use healthy plant oils, such as olive, canola, soy, corn, sunflower and  peanut. Check the labels,  and avoid partially hydrogenated oil, which have unhealthy trans fats.   If youre thirsty, drink water. Coffee and tea are good in moderation, but skip sugary drinks and limit milk and dairy products to one or two daily servings.   The type of carbohydrate in the diet is more important than the amount. Some sources of carbohydrates, such as vegetables, fruits, whole grains, and beans-are healthier than others.   Finally, stay active  Signed, Berniece Salines, DO  10/17/2020 11:06 AM    [place]

## 2020-10-17 NOTE — Patient Instructions (Addendum)
Medication Instructions:  Your physician recommends that you continue on your current medications as directed. Please refer to the Current Medication list given to you today.   *If you need a refill on your cardiac medications before your next appointment, please call your pharmacy*   Lab Work: None If you have labs (blood work) drawn today and your tests are completely normal, you will receive your results only by: [place] (if you have MyChart) OR A paper copy in the mail If you have any lab test that is abnormal or we need to change your treatment, we will call you to review the results.   Testing/Procedures: Your physician has requested that you have an echocardiogram. Echocardiography is a painless test that uses sound waves to create images of your heart. It provides your doctor with information about the size and shape of your heart and how well your heart's chambers and valves are working. This procedure takes approximately one hour. There are no restrictions for this procedure.    Follow-Up: At [facility], you and your health needs are our priority.  As part of our continuing mission to provide you with exceptional heart care, we have created designated Provider Care Teams.  These Care Teams include your primary Cardiologist (physician) and Advanced Practice Providers (APPs -  Physician Assistants and Nurse Practitioners) who all work together to provide you with the care you need, when you need it.  We recommend signing up for the patient portal called "MyChart".  Sign up information is provided on this After Visit Summary.  MyChart is used to connect with patients for Virtual Visits (Telemedicine).  Patients are able to view lab/test results, encounter notes, upcoming appointments, etc.  Non-urgent messages can be sent to your provider as well.   To learn more about what you can do with MyChart, go to [email or web address].    Your next appointment:   1  year(s)  The format for your next appointment:   In Person   Other Instructions

## 2020-10-27 ENCOUNTER — Ambulatory Visit (INDEPENDENT_AMBULATORY_CARE_PROVIDER_SITE_OTHER): Payer: Medicare Other

## 2020-10-27 ENCOUNTER — Other Ambulatory Visit: Payer: Self-pay

## 2020-10-27 DIAGNOSIS — R0602 Shortness of breath: Secondary | ICD-10-CM

## 2020-10-27 LAB — ECHOCARDIOGRAM COMPLETE
Area-P 1/2: 3.42 cm2
P 1/2 time: 648 msec
S' Lateral: 2.3 cm

## 2020-10-28 ENCOUNTER — Other Ambulatory Visit: Payer: Self-pay | Admitting: Cardiology

## 2020-10-28 MED ORDER — METOPROLOL SUCCINATE ER 25 MG PO TB24: 25.0000 mg | ORAL_TABLET | Freq: Every day | ORAL | 4 refills | Status: AC

## 2020-10-28 MED ORDER — ISOSORBIDE MONONITRATE ER 30 MG PO TB24: ORAL_TABLET | ORAL | 3 refills | Status: AC

## 2020-10-28 MED ORDER — PRAVASTATIN SODIUM 20 MG PO TABS: ORAL_TABLET | ORAL | 3 refills | Status: AC

## 2020-10-28 MED ORDER — FUROSEMIDE 40 MG PO TABS: ORAL_TABLET | ORAL | 3 refills | Status: AC

## 2020-10-28 MED ORDER — POTASSIUM CHLORIDE CRYS ER 20 MEQ PO TBCR: 20.0000 meq | EXTENDED_RELEASE_TABLET | Freq: Every day | ORAL | 4 refills | Status: AC

## 2020-10-28 MED ORDER — LISINOPRIL-HYDROCHLOROTHIAZIDE 20-25 MG PO TABS: 1.0000 | ORAL_TABLET | Freq: Every day | ORAL | 4 refills | Status: AC

## 2020-11-08 DIAGNOSIS — K219 Gastro-esophageal reflux disease without esophagitis: Secondary | ICD-10-CM | POA: Diagnosis not present

## 2020-11-08 DIAGNOSIS — R309 Painful micturition, unspecified: Secondary | ICD-10-CM | POA: Diagnosis not present

## 2020-11-08 DIAGNOSIS — I10 Essential (primary) hypertension: Secondary | ICD-10-CM | POA: Diagnosis not present

## 2020-11-08 DIAGNOSIS — N183 Chronic kidney disease, stage 3 unspecified: Secondary | ICD-10-CM | POA: Diagnosis not present

## 2020-11-08 DIAGNOSIS — N189 Chronic kidney disease, unspecified: Secondary | ICD-10-CM | POA: Diagnosis not present

## 2020-11-08 DIAGNOSIS — R809 Proteinuria, unspecified: Secondary | ICD-10-CM | POA: Diagnosis not present

## 2020-11-08 DIAGNOSIS — I509 Heart failure, unspecified: Secondary | ICD-10-CM | POA: Diagnosis not present

## 2020-11-22 DIAGNOSIS — I10 Essential (primary) hypertension: Secondary | ICD-10-CM | POA: Diagnosis not present

## 2020-11-22 DIAGNOSIS — W19XXXA Unspecified fall, initial encounter: Secondary | ICD-10-CM | POA: Diagnosis not present

## 2020-11-22 DIAGNOSIS — R5381 Other malaise: Secondary | ICD-10-CM | POA: Diagnosis not present

## 2020-11-22 DIAGNOSIS — S72002D Fracture of unspecified part of neck of left femur, subsequent encounter for closed fracture with routine healing: Secondary | ICD-10-CM | POA: Diagnosis not present

## 2020-11-22 DIAGNOSIS — R0789 Other chest pain: Secondary | ICD-10-CM | POA: Diagnosis not present

## 2020-11-22 DIAGNOSIS — N189 Chronic kidney disease, unspecified: Secondary | ICD-10-CM | POA: Diagnosis not present

## 2020-11-22 DIAGNOSIS — I129 Hypertensive chronic kidney disease with stage 1 through stage 4 chronic kidney disease, or unspecified chronic kidney disease: Secondary | ICD-10-CM | POA: Diagnosis not present

## 2020-11-22 DIAGNOSIS — J9811 Atelectasis: Secondary | ICD-10-CM | POA: Diagnosis not present

## 2020-11-22 DIAGNOSIS — S72002A Fracture of unspecified part of neck of left femur, initial encounter for closed fracture: Secondary | ICD-10-CM | POA: Diagnosis not present

## 2020-11-22 DIAGNOSIS — I959 Hypotension, unspecified: Secondary | ICD-10-CM | POA: Diagnosis not present

## 2020-11-22 DIAGNOSIS — G4700 Insomnia, unspecified: Secondary | ICD-10-CM | POA: Diagnosis not present

## 2020-11-22 DIAGNOSIS — T464X5A Adverse effect of angiotensin-converting-enzyme inhibitors, initial encounter: Secondary | ICD-10-CM | POA: Diagnosis not present

## 2020-11-22 DIAGNOSIS — S42001A Fracture of unspecified part of right clavicle, initial encounter for closed fracture: Secondary | ICD-10-CM | POA: Diagnosis not present

## 2020-11-22 DIAGNOSIS — M810 Age-related osteoporosis without current pathological fracture: Secondary | ICD-10-CM | POA: Diagnosis not present

## 2020-11-22 DIAGNOSIS — Z79899 Other long term (current) drug therapy: Secondary | ICD-10-CM | POA: Diagnosis not present

## 2020-11-22 DIAGNOSIS — R0902 Hypoxemia: Secondary | ICD-10-CM | POA: Diagnosis not present

## 2020-11-22 DIAGNOSIS — Z9181 History of falling: Secondary | ICD-10-CM | POA: Diagnosis not present

## 2020-11-22 DIAGNOSIS — G8929 Other chronic pain: Secondary | ICD-10-CM | POA: Diagnosis not present

## 2020-11-22 DIAGNOSIS — T447X5A Adverse effect of beta-adrenoreceptor antagonists, initial encounter: Secondary | ICD-10-CM | POA: Diagnosis not present

## 2020-11-22 DIAGNOSIS — R2689 Other abnormalities of gait and mobility: Secondary | ICD-10-CM | POA: Diagnosis not present

## 2020-11-22 DIAGNOSIS — E785 Hyperlipidemia, unspecified: Secondary | ICD-10-CM | POA: Diagnosis not present

## 2020-11-22 DIAGNOSIS — Z743 Need for continuous supervision: Secondary | ICD-10-CM | POA: Diagnosis not present

## 2020-11-22 DIAGNOSIS — N179 Acute kidney failure, unspecified: Secondary | ICD-10-CM | POA: Diagnosis not present

## 2020-11-22 DIAGNOSIS — W19XXXD Unspecified fall, subsequent encounter: Secondary | ICD-10-CM | POA: Diagnosis not present

## 2020-11-22 DIAGNOSIS — M79671 Pain in right foot: Secondary | ICD-10-CM | POA: Diagnosis not present

## 2020-11-22 DIAGNOSIS — D649 Anemia, unspecified: Secondary | ICD-10-CM | POA: Diagnosis not present

## 2020-11-22 DIAGNOSIS — S2242XA Multiple fractures of ribs, left side, initial encounter for closed fracture: Secondary | ICD-10-CM | POA: Diagnosis not present

## 2020-11-22 DIAGNOSIS — M7989 Other specified soft tissue disorders: Secondary | ICD-10-CM | POA: Diagnosis not present

## 2020-11-22 DIAGNOSIS — M6281 Muscle weakness (generalized): Secondary | ICD-10-CM | POA: Diagnosis not present

## 2020-11-22 DIAGNOSIS — Z87828 Personal history of other (healed) physical injury and trauma: Secondary | ICD-10-CM | POA: Diagnosis not present

## 2020-11-22 DIAGNOSIS — M25552 Pain in left hip: Secondary | ICD-10-CM | POA: Diagnosis not present

## 2020-11-22 DIAGNOSIS — M79672 Pain in left foot: Secondary | ICD-10-CM | POA: Diagnosis not present

## 2020-11-22 DIAGNOSIS — K219 Gastro-esophageal reflux disease without esophagitis: Secondary | ICD-10-CM | POA: Diagnosis not present

## 2020-11-22 DIAGNOSIS — M1612 Unilateral primary osteoarthritis, left hip: Secondary | ICD-10-CM | POA: Diagnosis not present

## 2020-11-22 DIAGNOSIS — M1990 Unspecified osteoarthritis, unspecified site: Secondary | ICD-10-CM | POA: Diagnosis not present

## 2020-11-22 DIAGNOSIS — K449 Diaphragmatic hernia without obstruction or gangrene: Secondary | ICD-10-CM | POA: Diagnosis not present

## 2020-11-22 DIAGNOSIS — S2243XA Multiple fractures of ribs, bilateral, initial encounter for closed fracture: Secondary | ICD-10-CM | POA: Diagnosis not present

## 2020-11-22 DIAGNOSIS — S72145A Nondisplaced intertrochanteric fracture of left femur, initial encounter for closed fracture: Secondary | ICD-10-CM | POA: Diagnosis not present

## 2020-11-22 DIAGNOSIS — S72142A Displaced intertrochanteric fracture of left femur, initial encounter for closed fracture: Secondary | ICD-10-CM | POA: Diagnosis not present

## 2020-11-30 DIAGNOSIS — N179 Acute kidney failure, unspecified: Secondary | ICD-10-CM | POA: Diagnosis not present

## 2020-11-30 DIAGNOSIS — N189 Chronic kidney disease, unspecified: Secondary | ICD-10-CM | POA: Diagnosis not present

## 2020-11-30 DIAGNOSIS — R079 Chest pain, unspecified: Secondary | ICD-10-CM | POA: Diagnosis not present

## 2020-11-30 DIAGNOSIS — R262 Difficulty in walking, not elsewhere classified: Secondary | ICD-10-CM | POA: Diagnosis not present

## 2020-11-30 DIAGNOSIS — G4700 Insomnia, unspecified: Secondary | ICD-10-CM | POA: Diagnosis not present

## 2020-11-30 DIAGNOSIS — W19XXXD Unspecified fall, subsequent encounter: Secondary | ICD-10-CM | POA: Diagnosis not present

## 2020-11-30 DIAGNOSIS — G8929 Other chronic pain: Secondary | ICD-10-CM | POA: Diagnosis not present

## 2020-11-30 DIAGNOSIS — M6281 Muscle weakness (generalized): Secondary | ICD-10-CM | POA: Diagnosis not present

## 2020-11-30 DIAGNOSIS — R5381 Other malaise: Secondary | ICD-10-CM | POA: Diagnosis not present

## 2020-11-30 DIAGNOSIS — I10 Essential (primary) hypertension: Secondary | ICD-10-CM | POA: Diagnosis not present

## 2020-11-30 DIAGNOSIS — R0789 Other chest pain: Secondary | ICD-10-CM | POA: Diagnosis not present

## 2020-11-30 DIAGNOSIS — S72002D Fracture of unspecified part of neck of left femur, subsequent encounter for closed fracture with routine healing: Secondary | ICD-10-CM | POA: Diagnosis not present

## 2020-11-30 DIAGNOSIS — Z743 Need for continuous supervision: Secondary | ICD-10-CM | POA: Diagnosis not present

## 2020-11-30 DIAGNOSIS — R2689 Other abnormalities of gait and mobility: Secondary | ICD-10-CM | POA: Diagnosis not present

## 2020-11-30 DIAGNOSIS — M810 Age-related osteoporosis without current pathological fracture: Secondary | ICD-10-CM | POA: Diagnosis not present

## 2020-11-30 DIAGNOSIS — G8918 Other acute postprocedural pain: Secondary | ICD-10-CM | POA: Diagnosis not present

## 2020-11-30 DIAGNOSIS — S72142A Displaced intertrochanteric fracture of left femur, initial encounter for closed fracture: Secondary | ICD-10-CM | POA: Diagnosis not present

## 2020-11-30 DIAGNOSIS — R109 Unspecified abdominal pain: Secondary | ICD-10-CM | POA: Diagnosis not present

## 2020-11-30 DIAGNOSIS — R840 Abnormal level of enzymes in specimens from respiratory organs and thorax: Secondary | ICD-10-CM | POA: Diagnosis not present

## 2020-11-30 DIAGNOSIS — E785 Hyperlipidemia, unspecified: Secondary | ICD-10-CM | POA: Diagnosis not present

## 2020-11-30 DIAGNOSIS — D649 Anemia, unspecified: Secondary | ICD-10-CM | POA: Diagnosis not present

## 2020-11-30 DIAGNOSIS — W19XXXA Unspecified fall, initial encounter: Secondary | ICD-10-CM | POA: Diagnosis not present

## 2020-11-30 DIAGNOSIS — K219 Gastro-esophageal reflux disease without esophagitis: Secondary | ICD-10-CM | POA: Diagnosis not present

## 2020-11-30 DIAGNOSIS — K5900 Constipation, unspecified: Secondary | ICD-10-CM | POA: Diagnosis not present

## 2020-12-01 DIAGNOSIS — D649 Anemia, unspecified: Secondary | ICD-10-CM | POA: Diagnosis not present

## 2020-12-01 DIAGNOSIS — G8918 Other acute postprocedural pain: Secondary | ICD-10-CM | POA: Diagnosis not present

## 2020-12-01 DIAGNOSIS — R262 Difficulty in walking, not elsewhere classified: Secondary | ICD-10-CM | POA: Diagnosis not present

## 2020-12-01 DIAGNOSIS — S72002D Fracture of unspecified part of neck of left femur, subsequent encounter for closed fracture with routine healing: Secondary | ICD-10-CM | POA: Diagnosis not present

## 2020-12-10 DIAGNOSIS — K5900 Constipation, unspecified: Secondary | ICD-10-CM | POA: Diagnosis not present

## 2020-12-14 DIAGNOSIS — R079 Chest pain, unspecified: Secondary | ICD-10-CM | POA: Diagnosis not present

## 2020-12-14 DIAGNOSIS — R109 Unspecified abdominal pain: Secondary | ICD-10-CM | POA: Diagnosis not present

## 2020-12-17 DIAGNOSIS — Z79899 Other long term (current) drug therapy: Secondary | ICD-10-CM | POA: Diagnosis not present

## 2020-12-17 DIAGNOSIS — M7989 Other specified soft tissue disorders: Secondary | ICD-10-CM | POA: Diagnosis not present

## 2020-12-17 DIAGNOSIS — N170 Acute kidney failure with tubular necrosis: Secondary | ICD-10-CM | POA: Diagnosis not present

## 2020-12-17 DIAGNOSIS — R0902 Hypoxemia: Secondary | ICD-10-CM | POA: Diagnosis not present

## 2020-12-17 DIAGNOSIS — K449 Diaphragmatic hernia without obstruction or gangrene: Secondary | ICD-10-CM | POA: Diagnosis not present

## 2020-12-17 DIAGNOSIS — F32A Depression, unspecified: Secondary | ICD-10-CM | POA: Diagnosis not present

## 2020-12-17 DIAGNOSIS — I2699 Other pulmonary embolism without acute cor pulmonale: Secondary | ICD-10-CM | POA: Diagnosis not present

## 2020-12-17 DIAGNOSIS — I2510 Atherosclerotic heart disease of native coronary artery without angina pectoris: Secondary | ICD-10-CM | POA: Diagnosis not present

## 2020-12-17 DIAGNOSIS — J9601 Acute respiratory failure with hypoxia: Secondary | ICD-10-CM | POA: Diagnosis not present

## 2020-12-17 DIAGNOSIS — Z66 Do not resuscitate: Secondary | ICD-10-CM | POA: Diagnosis not present

## 2020-12-17 DIAGNOSIS — I129 Hypertensive chronic kidney disease with stage 1 through stage 4 chronic kidney disease, or unspecified chronic kidney disease: Secondary | ICD-10-CM | POA: Diagnosis not present

## 2020-12-17 DIAGNOSIS — R110 Nausea: Secondary | ICD-10-CM | POA: Diagnosis not present

## 2020-12-17 DIAGNOSIS — Z20822 Contact with and (suspected) exposure to covid-19: Secondary | ICD-10-CM | POA: Diagnosis not present

## 2020-12-17 DIAGNOSIS — E872 Acidosis, unspecified: Secondary | ICD-10-CM | POA: Diagnosis not present

## 2020-12-17 DIAGNOSIS — E785 Hyperlipidemia, unspecified: Secondary | ICD-10-CM | POA: Diagnosis not present

## 2020-12-17 DIAGNOSIS — J90 Pleural effusion, not elsewhere classified: Secondary | ICD-10-CM | POA: Diagnosis not present

## 2020-12-17 DIAGNOSIS — Z743 Need for continuous supervision: Secondary | ICD-10-CM | POA: Diagnosis not present

## 2020-12-17 DIAGNOSIS — I509 Heart failure, unspecified: Secondary | ICD-10-CM | POA: Diagnosis not present

## 2020-12-17 DIAGNOSIS — N189 Chronic kidney disease, unspecified: Secondary | ICD-10-CM | POA: Diagnosis not present

## 2020-12-17 DIAGNOSIS — N179 Acute kidney failure, unspecified: Secondary | ICD-10-CM | POA: Diagnosis not present

## 2020-12-17 DIAGNOSIS — I82432 Acute embolism and thrombosis of left popliteal vein: Secondary | ICD-10-CM | POA: Diagnosis not present

## 2020-12-17 DIAGNOSIS — I824Z2 Acute embolism and thrombosis of unspecified deep veins of left distal lower extremity: Secondary | ICD-10-CM | POA: Diagnosis not present

## 2020-12-17 DIAGNOSIS — N182 Chronic kidney disease, stage 2 (mild): Secondary | ICD-10-CM | POA: Diagnosis not present

## 2020-12-17 DIAGNOSIS — I2694 Multiple subsegmental pulmonary emboli without acute cor pulmonale: Secondary | ICD-10-CM | POA: Diagnosis not present

## 2020-12-17 DIAGNOSIS — Z7901 Long term (current) use of anticoagulants: Secondary | ICD-10-CM | POA: Diagnosis not present

## 2020-12-17 DIAGNOSIS — K5900 Constipation, unspecified: Secondary | ICD-10-CM | POA: Diagnosis not present

## 2020-12-17 DIAGNOSIS — R079 Chest pain, unspecified: Secondary | ICD-10-CM | POA: Diagnosis not present

## 2020-12-17 DIAGNOSIS — J8410 Pulmonary fibrosis, unspecified: Secondary | ICD-10-CM | POA: Diagnosis not present

## 2020-12-17 DIAGNOSIS — I110 Hypertensive heart disease with heart failure: Secondary | ICD-10-CM | POA: Diagnosis not present

## 2020-12-17 DIAGNOSIS — J9811 Atelectasis: Secondary | ICD-10-CM | POA: Diagnosis not present

## 2020-12-17 DIAGNOSIS — S72002D Fracture of unspecified part of neck of left femur, subsequent encounter for closed fracture with routine healing: Secondary | ICD-10-CM | POA: Diagnosis not present

## 2020-12-17 DIAGNOSIS — R531 Weakness: Secondary | ICD-10-CM | POA: Diagnosis not present

## 2020-12-17 DIAGNOSIS — I82412 Acute embolism and thrombosis of left femoral vein: Secondary | ICD-10-CM | POA: Diagnosis not present

## 2020-12-17 DIAGNOSIS — Z792 Long term (current) use of antibiotics: Secondary | ICD-10-CM | POA: Diagnosis not present

## 2020-12-17 DIAGNOSIS — R5381 Other malaise: Secondary | ICD-10-CM | POA: Diagnosis not present

## 2020-12-17 DIAGNOSIS — E46 Unspecified protein-calorie malnutrition: Secondary | ICD-10-CM | POA: Diagnosis not present

## 2020-12-17 DIAGNOSIS — K219 Gastro-esophageal reflux disease without esophagitis: Secondary | ICD-10-CM | POA: Diagnosis not present

## 2020-12-18 DIAGNOSIS — J8410 Pulmonary fibrosis, unspecified: Secondary | ICD-10-CM | POA: Diagnosis not present

## 2020-12-18 DIAGNOSIS — J9601 Acute respiratory failure with hypoxia: Secondary | ICD-10-CM | POA: Diagnosis not present

## 2020-12-18 DIAGNOSIS — J9811 Atelectasis: Secondary | ICD-10-CM | POA: Diagnosis not present

## 2020-12-18 DIAGNOSIS — J90 Pleural effusion, not elsewhere classified: Secondary | ICD-10-CM | POA: Diagnosis not present

## 2020-12-18 DIAGNOSIS — I2694 Multiple subsegmental pulmonary emboli without acute cor pulmonale: Secondary | ICD-10-CM | POA: Diagnosis not present

## 2020-12-18 DIAGNOSIS — I2699 Other pulmonary embolism without acute cor pulmonale: Secondary | ICD-10-CM | POA: Diagnosis not present

## 2020-12-18 DIAGNOSIS — R079 Chest pain, unspecified: Secondary | ICD-10-CM | POA: Diagnosis not present

## 2020-12-18 DIAGNOSIS — K449 Diaphragmatic hernia without obstruction or gangrene: Secondary | ICD-10-CM | POA: Diagnosis not present

## 2020-12-19 DIAGNOSIS — J9601 Acute respiratory failure with hypoxia: Secondary | ICD-10-CM | POA: Diagnosis not present

## 2020-12-19 DIAGNOSIS — I2699 Other pulmonary embolism without acute cor pulmonale: Secondary | ICD-10-CM | POA: Diagnosis not present

## 2020-12-20 DIAGNOSIS — S72002D Fracture of unspecified part of neck of left femur, subsequent encounter for closed fracture with routine healing: Secondary | ICD-10-CM | POA: Diagnosis not present

## 2020-12-20 DIAGNOSIS — I2699 Other pulmonary embolism without acute cor pulmonale: Secondary | ICD-10-CM | POA: Diagnosis not present

## 2020-12-20 DIAGNOSIS — J9601 Acute respiratory failure with hypoxia: Secondary | ICD-10-CM | POA: Diagnosis not present

## 2020-12-22 ENCOUNTER — Ambulatory Visit: Payer: Medicare Other | Admitting: Cardiology

## 2020-12-24 DIAGNOSIS — N179 Acute kidney failure, unspecified: Secondary | ICD-10-CM | POA: Diagnosis not present

## 2020-12-28 DIAGNOSIS — I2699 Other pulmonary embolism without acute cor pulmonale: Secondary | ICD-10-CM | POA: Diagnosis not present

## 2020-12-28 DIAGNOSIS — J9601 Acute respiratory failure with hypoxia: Secondary | ICD-10-CM | POA: Diagnosis not present

## 2021-01-03 DEATH — deceased
